# Patient Record
Sex: Female | Born: 1935 | Race: White | Hispanic: No | Smoking: Never smoker
Health system: Southern US, Community
[De-identification: ages and names within clinical notes are randomized; demographics above are authoritative.]

## PROBLEM LIST (undated history)

## (undated) DIAGNOSIS — L102 Pemphigus foliaceous: Secondary | ICD-10-CM

## (undated) DIAGNOSIS — I350 Nonrheumatic aortic (valve) stenosis: Secondary | ICD-10-CM

## (undated) DIAGNOSIS — Z8701 Personal history of pneumonia (recurrent): Secondary | ICD-10-CM

## (undated) DIAGNOSIS — I10 Essential (primary) hypertension: Secondary | ICD-10-CM

## (undated) DIAGNOSIS — I255 Ischemic cardiomyopathy: Secondary | ICD-10-CM

## (undated) DIAGNOSIS — I2109 ST elevation (STEMI) myocardial infarction involving other coronary artery of anterior wall: Secondary | ICD-10-CM

## (undated) DIAGNOSIS — F0390 Unspecified dementia without behavioral disturbance: Secondary | ICD-10-CM

## (undated) DIAGNOSIS — I5042 Chronic combined systolic (congestive) and diastolic (congestive) heart failure: Secondary | ICD-10-CM

## (undated) DIAGNOSIS — E119 Type 2 diabetes mellitus without complications: Secondary | ICD-10-CM

## (undated) DIAGNOSIS — J449 Chronic obstructive pulmonary disease, unspecified: Secondary | ICD-10-CM

## (undated) DIAGNOSIS — R609 Edema, unspecified: Secondary | ICD-10-CM

## (undated) HISTORY — DX: Chronic combined systolic (congestive) and diastolic (congestive) heart failure: I50.42

## (undated) HISTORY — DX: Pemphigus foliaceous: L10.2

## (undated) HISTORY — PX: ABDOMINAL SURGERY: SHX537

## (undated) HISTORY — DX: Ischemic cardiomyopathy: I25.5

## (undated) HISTORY — DX: Personal history of pneumonia (recurrent): Z87.01

## (undated) HISTORY — DX: Nonrheumatic aortic (valve) stenosis: I35.0

## (undated) HISTORY — DX: ST elevation (STEMI) myocardial infarction involving other coronary artery of anterior wall: I21.09

## (undated) HISTORY — DX: Essential (primary) hypertension: I10

---

## 2001-03-21 ENCOUNTER — Encounter: Payer: Self-pay | Admitting: Internal Medicine

## 2001-03-21 ENCOUNTER — Ambulatory Visit (HOSPITAL_COMMUNITY): Admission: RE | Admit: 2001-03-21 | Discharge: 2001-03-21 | Payer: Self-pay | Admitting: Internal Medicine

## 2001-08-06 ENCOUNTER — Other Ambulatory Visit: Admission: RE | Admit: 2001-08-06 | Discharge: 2001-08-06 | Payer: Self-pay | Admitting: Obstetrics and Gynecology

## 2002-03-23 ENCOUNTER — Encounter: Payer: Self-pay | Admitting: Internal Medicine

## 2002-03-23 ENCOUNTER — Ambulatory Visit (HOSPITAL_COMMUNITY): Admission: RE | Admit: 2002-03-23 | Discharge: 2002-03-23 | Payer: Self-pay | Admitting: Internal Medicine

## 2003-03-26 ENCOUNTER — Ambulatory Visit (HOSPITAL_COMMUNITY): Admission: RE | Admit: 2003-03-26 | Discharge: 2003-03-26 | Payer: Self-pay | Admitting: Internal Medicine

## 2003-03-26 ENCOUNTER — Encounter: Payer: Self-pay | Admitting: Internal Medicine

## 2004-03-27 ENCOUNTER — Ambulatory Visit (HOSPITAL_COMMUNITY): Admission: RE | Admit: 2004-03-27 | Discharge: 2004-03-27 | Payer: Self-pay | Admitting: Internal Medicine

## 2005-03-28 ENCOUNTER — Ambulatory Visit (HOSPITAL_COMMUNITY): Admission: RE | Admit: 2005-03-28 | Discharge: 2005-03-28 | Payer: Self-pay | Admitting: Internal Medicine

## 2006-05-27 ENCOUNTER — Ambulatory Visit (HOSPITAL_COMMUNITY): Admission: RE | Admit: 2006-05-27 | Discharge: 2006-05-27 | Payer: Self-pay | Admitting: Internal Medicine

## 2007-09-09 ENCOUNTER — Ambulatory Visit (HOSPITAL_COMMUNITY): Admission: RE | Admit: 2007-09-09 | Discharge: 2007-09-09 | Payer: Self-pay | Admitting: Internal Medicine

## 2010-07-18 ENCOUNTER — Ambulatory Visit (HOSPITAL_COMMUNITY)
Admission: RE | Admit: 2010-07-18 | Discharge: 2010-07-18 | Payer: Self-pay | Source: Home / Self Care | Attending: Internal Medicine | Admitting: Internal Medicine

## 2010-08-02 ENCOUNTER — Ambulatory Visit (HOSPITAL_COMMUNITY)
Admission: RE | Admit: 2010-08-02 | Discharge: 2010-08-02 | Payer: Self-pay | Source: Home / Self Care | Attending: Internal Medicine | Admitting: Internal Medicine

## 2014-01-30 ENCOUNTER — Encounter (HOSPITAL_COMMUNITY): Payer: Self-pay | Admitting: Emergency Medicine

## 2014-01-30 ENCOUNTER — Emergency Department (HOSPITAL_COMMUNITY): Payer: Medicare Other

## 2014-01-30 ENCOUNTER — Other Ambulatory Visit: Payer: Self-pay

## 2014-01-30 ENCOUNTER — Inpatient Hospital Stay (HOSPITAL_COMMUNITY)
Admission: EM | Admit: 2014-01-30 | Discharge: 2014-02-04 | DRG: 280 | Disposition: A | Discharge status: Skilled Nursing Facility | Payer: Medicare Other | Attending: Internal Medicine | Admitting: Internal Medicine

## 2014-01-30 DIAGNOSIS — I5041 Acute combined systolic (congestive) and diastolic (congestive) heart failure: Secondary | ICD-10-CM | POA: Diagnosis present

## 2014-01-30 DIAGNOSIS — I7090 Unspecified atherosclerosis: Secondary | ICD-10-CM | POA: Diagnosis present

## 2014-01-30 DIAGNOSIS — Z7982 Long term (current) use of aspirin: Secondary | ICD-10-CM

## 2014-01-30 DIAGNOSIS — I359 Nonrheumatic aortic valve disorder, unspecified: Secondary | ICD-10-CM | POA: Diagnosis present

## 2014-01-30 DIAGNOSIS — F0390 Unspecified dementia without behavioral disturbance: Secondary | ICD-10-CM | POA: Diagnosis present

## 2014-01-30 DIAGNOSIS — E785 Hyperlipidemia, unspecified: Secondary | ICD-10-CM

## 2014-01-30 DIAGNOSIS — I2109 ST elevation (STEMI) myocardial infarction involving other coronary artery of anterior wall: Principal | ICD-10-CM | POA: Diagnosis present

## 2014-01-30 DIAGNOSIS — I219 Acute myocardial infarction, unspecified: Secondary | ICD-10-CM | POA: Diagnosis present

## 2014-01-30 DIAGNOSIS — E119 Type 2 diabetes mellitus without complications: Secondary | ICD-10-CM | POA: Diagnosis present

## 2014-01-30 DIAGNOSIS — I213 ST elevation (STEMI) myocardial infarction of unspecified site: Secondary | ICD-10-CM

## 2014-01-30 DIAGNOSIS — R21 Rash and other nonspecific skin eruption: Secondary | ICD-10-CM | POA: Diagnosis present

## 2014-01-30 DIAGNOSIS — I509 Heart failure, unspecified: Secondary | ICD-10-CM | POA: Diagnosis present

## 2014-01-30 DIAGNOSIS — R600 Localized edema: Secondary | ICD-10-CM | POA: Diagnosis present

## 2014-01-30 DIAGNOSIS — R609 Edema, unspecified: Secondary | ICD-10-CM

## 2014-01-30 DIAGNOSIS — E876 Hypokalemia: Secondary | ICD-10-CM | POA: Diagnosis present

## 2014-01-30 DIAGNOSIS — I255 Ischemic cardiomyopathy: Secondary | ICD-10-CM

## 2014-01-30 DIAGNOSIS — I5021 Acute systolic (congestive) heart failure: Secondary | ICD-10-CM

## 2014-01-30 DIAGNOSIS — I519 Heart disease, unspecified: Secondary | ICD-10-CM

## 2014-01-30 DIAGNOSIS — Z66 Do not resuscitate: Secondary | ICD-10-CM | POA: Diagnosis present

## 2014-01-30 DIAGNOSIS — I10 Essential (primary) hypertension: Secondary | ICD-10-CM | POA: Diagnosis present

## 2014-01-30 LAB — BASIC METABOLIC PANEL
Anion gap: 12 (ref 5–15)
BUN: 24 mg/dL — ABNORMAL HIGH (ref 6–23)
CO2: 25 mEq/L (ref 19–32)
Calcium: 9.3 mg/dL (ref 8.4–10.5)
Chloride: 104 mEq/L (ref 96–112)
Creatinine, Ser: 0.71 mg/dL (ref 0.50–1.10)
GFR calc Af Amer: 81 mL/min — ABNORMAL LOW (ref >90)
GFR calc non Af Amer: 70 mL/min — ABNORMAL LOW (ref >90)
Glucose, Bld: 144 mg/dL — ABNORMAL HIGH (ref 70–99)
Potassium: 4.6 mEq/L (ref 3.7–5.3)
Sodium: 141 mEq/L (ref 137–147)

## 2014-01-30 LAB — URINALYSIS, ROUTINE W REFLEX MICROSCOPIC
Bilirubin Urine: NEGATIVE
Glucose, UA: NEGATIVE mg/dL
Hgb urine dipstick: NEGATIVE
Ketones, ur: NEGATIVE mg/dL
Leukocytes, UA: NEGATIVE
Nitrite: NEGATIVE
Protein, ur: NEGATIVE mg/dL
Specific Gravity, Urine: 1.02 (ref 1.005–1.030)
Urobilinogen, UA: 0.2 mg/dL (ref 0.0–1.0)
pH: 5 (ref 5.0–8.0)

## 2014-01-30 LAB — CBC WITH DIFFERENTIAL/PLATELET
Basophils Absolute: 0 10*3/uL (ref 0.0–0.1)
Basophils Relative: 1 % (ref 0–1)
Eosinophils Absolute: 0 10*3/uL (ref 0.0–0.7)
Eosinophils Relative: 0 % (ref 0–5)
HCT: 40.8 % (ref 36.0–46.0)
Hemoglobin: 13.5 g/dL (ref 12.0–15.0)
Lymphocytes Relative: 8 % — ABNORMAL LOW (ref 12–46)
Lymphs Abs: 0.6 10*3/uL — ABNORMAL LOW (ref 0.7–4.0)
MCH: 31.9 pg (ref 26.0–34.0)
MCHC: 33.1 g/dL (ref 30.0–36.0)
MCV: 96.5 fL (ref 78.0–100.0)
Monocytes Absolute: 0.7 10*3/uL (ref 0.1–1.0)
Monocytes Relative: 9 % (ref 3–12)
Neutro Abs: 6.5 10*3/uL (ref 1.7–7.7)
Neutrophils Relative %: 82 % — ABNORMAL HIGH (ref 43–77)
Platelets: 247 10*3/uL (ref 150–400)
RBC: 4.23 MIL/uL (ref 3.87–5.11)
RDW: 14.3 % (ref 11.5–15.5)
WBC: 7.8 10*3/uL (ref 4.0–10.5)

## 2014-01-30 LAB — TROPONIN I
Troponin I: 2.19 ng/mL (ref <0.30)
Troponin I: 1.81 ng/mL (ref <0.30)
Troponin I: 1.66 ng/mL (ref <0.30)
Troponin I: 1.53 ng/mL (ref <0.30)

## 2014-01-30 LAB — PRO B NATRIURETIC PEPTIDE: Pro B Natriuretic peptide (BNP): 15235 pg/mL — ABNORMAL HIGH (ref 0–450)

## 2014-01-30 LAB — TSH: TSH: 6.38 u[IU]/mL — ABNORMAL HIGH (ref 0.350–4.500)

## 2014-01-30 LAB — HEPARIN LEVEL (UNFRACTIONATED): Heparin Unfractionated: 0.16 IU/mL — ABNORMAL LOW (ref 0.30–0.70)

## 2014-01-30 LAB — MRSA PCR SCREENING: MRSA BY PCR: NEGATIVE

## 2014-01-30 MED ORDER — ASPIRIN 81 MG PO CHEW
324.0000 mg | CHEWABLE_TABLET | ORAL | Status: AC
  Administered 2014-01-30: 324 mg via ORAL
  Filled 2014-01-30: qty 4

## 2014-01-30 MED ORDER — ATORVASTATIN CALCIUM 40 MG PO TABS
40.0000 mg | ORAL_TABLET | Freq: Every day | ORAL | Status: DC
  Administered 2014-01-30 – 2014-02-03 (×5): 40 mg via ORAL
  Filled 2014-01-30 (×6): qty 1

## 2014-01-30 MED ORDER — HEPARIN (PORCINE) IN NACL 100-0.45 UNIT/ML-% IJ SOLN
900.0000 [IU]/h | INTRAMUSCULAR | Status: DC
  Administered 2014-01-30 (×2): 700 [IU]/h via INTRAVENOUS
  Administered 2014-01-30 – 2014-01-31 (×2): 900 [IU]/h via INTRAVENOUS
  Filled 2014-01-30 (×2): qty 250

## 2014-01-30 MED ORDER — HEPARIN BOLUS VIA INFUSION
1000.0000 [IU] | Freq: Once | INTRAVENOUS | Status: AC
  Administered 2014-01-30: 1000 [IU] via INTRAVENOUS
  Filled 2014-01-30: qty 1000

## 2014-01-30 MED ORDER — FUROSEMIDE 10 MG/ML IJ SOLN
40.0000 mg | Freq: Two times a day (BID) | INTRAMUSCULAR | Status: DC
  Administered 2014-01-30 – 2014-01-31 (×3): 40 mg via INTRAVENOUS
  Filled 2014-01-30 (×4): qty 4

## 2014-01-30 MED ORDER — ASPIRIN 81 MG PO CHEW
324.0000 mg | CHEWABLE_TABLET | Freq: Once | ORAL | Status: AC
  Administered 2014-01-30: 324 mg via ORAL
  Filled 2014-01-30: qty 4

## 2014-01-30 MED ORDER — ONDANSETRON HCL 4 MG/2ML IJ SOLN
4.0000 mg | Freq: Four times a day (QID) | INTRAMUSCULAR | Status: DC | PRN
  Administered 2014-01-30 – 2014-02-04 (×2): 4 mg via INTRAVENOUS
  Filled 2014-01-30 (×2): qty 2

## 2014-01-30 MED ORDER — ACETAMINOPHEN 325 MG PO TABS
650.0000 mg | ORAL_TABLET | ORAL | Status: DC | PRN
Start: 2014-01-30 — End: 2014-02-04
  Administered 2014-01-30: 650 mg via ORAL
  Filled 2014-01-30: qty 2

## 2014-01-30 MED ORDER — FUROSEMIDE 10 MG/ML IJ SOLN
40.0000 mg | Freq: Once | INTRAMUSCULAR | Status: AC
  Administered 2014-01-30: 40 mg via INTRAVENOUS
  Filled 2014-01-30: qty 4

## 2014-01-30 MED ORDER — ASPIRIN 300 MG RE SUPP: 300.0000 mg | RECTAL | Status: AC

## 2014-01-30 MED ORDER — HEPARIN BOLUS VIA INFUSION
3000.0000 [IU] | Freq: Once | INTRAVENOUS | Status: AC
  Administered 2014-01-30: 3000 [IU] via INTRAVENOUS

## 2014-01-30 MED ORDER — METOPROLOL TARTRATE 25 MG PO TABS
25.0000 mg | ORAL_TABLET | Freq: Two times a day (BID) | ORAL | Status: DC
  Administered 2014-01-30 – 2014-02-01 (×5): 25 mg via ORAL
  Filled 2014-01-30 (×5): qty 1

## 2014-01-30 MED ORDER — NITROGLYCERIN 0.4 MG SL SUBL: 0.4000 mg | SUBLINGUAL_TABLET | SUBLINGUAL | Status: DC | PRN

## 2014-01-30 MED ORDER — ALBUTEROL SULFATE (2.5 MG/3ML) 0.083% IN NEBU
INHALATION_SOLUTION | RESPIRATORY_TRACT | Status: AC
  Administered 2014-01-30: 2.5 mg
  Filled 2014-01-30: qty 3

## 2014-01-30 MED ORDER — LOSARTAN POTASSIUM 50 MG PO TABS
100.0000 mg | ORAL_TABLET | Freq: Every day | ORAL | Status: DC
  Administered 2014-01-30 – 2014-02-04 (×6): 100 mg via ORAL
  Filled 2014-01-30 (×6): qty 2

## 2014-01-30 MED ORDER — ASPIRIN EC 81 MG PO TBEC
81.0000 mg | DELAYED_RELEASE_TABLET | Freq: Every day | ORAL | Status: DC
  Administered 2014-01-31 – 2014-02-01 (×2): 81 mg via ORAL
  Filled 2014-01-30 (×3): qty 1

## 2014-01-30 MED ORDER — METOPROLOL TARTRATE 1 MG/ML IV SOLN
2.5000 mg | Freq: Once | INTRAVENOUS | Status: AC
  Administered 2014-01-30: 2.5 mg via INTRAVENOUS
  Filled 2014-01-30: qty 5

## 2014-01-30 NOTE — H&P (Signed)
[facility] History and Physical  Carla Ward ASN:[id number] DOB: 10-22-1914 DOA: 01/30/2014  Referring physician: Dr. Juleen China PCP: Carylon Perches, MD   Chief Complaint: chest pain  HPI: Carla Ward is a 80 y.o. female with history of hypertension, chronic lower extremity edema, no documented cardiac history per patient, presents to the emergency room with complaints of chest pain. She reports onset of symptoms occurring last night around 9:30pm. She describes substernal chest pain which was nonradiating. Associated with shortness of breath, nausea, diaphoresis. She reports having these symptoms for several hours through the night until she called EMS early in the morning. Upon arrival to the emergency room, she reports that her chest pain had resolved. She still does feel mildly short of breath. She's had no fever. She has a chronic cough. No vomiting or diarrhea. She is a nonsmoker. She was evaluated in the emergency room where troponin was slightly positive at 2.19. EKG showed ST elevations in 1, aVL and V6 as well as Q waves in V2 V3. Case was discussed by Dr. Juleen China with Dr. Ladona Ridgel, on call for cardiology at [facility]. It was felt that since patient is no longer having active chest pain, she is hemodynamically stable, and with her advanced age, cardiac catheterization will likely not be pursued at this time. Recommendations were to continue with medical management and admit to [facility]   Review of Systems:  Pertinent positives as per HPI, otherwise negative.  Past Medical History  Diagnosis Date  . Hypertension    Past Surgical History  Procedure Laterality Date  . Abdominal surgery     Social History:  reports that she has never smoked. She does not have any smokeless tobacco history on file. She reports that she does not drink alcohol or use illicit drugs.  Not on File  Family history: reviewed with patient, non contributory   Prior to Admission medications   Not  on File   Physical Exam: Filed Vitals:   01/30/14 0930  BP: 137/79  Pulse: 78  Temp:   Resp: 17    BP 137/79  Pulse 78  Temp(Src) 97.8 F (36.6 C) (Oral)  Resp 17  Ht 5\' 3"  (1.6 m)  Wt 57.153 kg (126 lb)  BMI 22.33 kg/m2  SpO2 97%  General:  Appears calm and comfortable Eyes: PERRL, normal lids, irises & conjunctiva ENT: grossly normal hearing, lips & tongue Neck: no LAD, masses or thyromegaly Cardiovascular: RRR, systolic ejection murmur. 2-3+ edema bilaterally Telemetry: SR, no arrhythmias  Respiratory: crackles at bases Abdomen: soft, ntnd Skin: no rash or induration seen on limited exam Musculoskeletal: grossly normal tone BUE/BLE Psychiatric: grossly normal mood and affect, speech fluent and appropriate Neurologic: grossly non-focal.          Labs on Admission:  Basic Metabolic Panel:  Recent Labs Lab 01/30/14 0743  NA 141  K 4.6  CL 104  CO2 25  GLUCOSE 144*  BUN 24*  CREATININE 0.71  CALCIUM 9.3   Liver Function Tests: No results found for this basename: AST, ALT, ALKPHOS, BILITOT, PROT, ALBUMIN,  in the last 168 hours No results found for this basename: LIPASE, AMYLASE,  in the last 168 hours No results found for this basename: AMMONIA,  in the last 168 hours CBC:  Recent Labs Lab 01/30/14 0743  WBC 7.8  NEUTROABS 6.5  HGB 13.5  HCT 40.8  MCV 96.5  PLT 247   Cardiac Enzymes:  Recent Labs Lab 01/30/14 0743  TROPONINI 2.19*  BNP (last 3 results)  Recent Labs  01/30/14 0743  PROBNP 15235.0*   CBG: No results found for this basename: GLUCAP,  in the last 168 hours  Radiological Exams on Admission: Dg Chest Portable 1 View  01/30/2014   CLINICAL DATA:  Weakness.  EXAM: PORTABLE CHEST - 1 VIEW  COMPARISON:  None.  FINDINGS: The heart is mildly enlarged. Bilateral pleural effusions are present. Bibasilar airspace disease likely reflects atelectasis. Mild diffuse edema is superimposed on chronic interstitial coarsening.  Atherosclerotic changes are noted at the aortic arch. The upper lung fields are clear consolidation. The visualized soft tissues and bony thorax are unremarkable.  IMPRESSION: 1. Congestive heart failure. 2. Bibasilar airspace disease likely reflects atelectasis. 3. Atherosclerosis.   Electronically Signed   By: Gennette Pachris  Mattern M.D.   On: 01/30/2014 08:31    EKG: Independently reviewed. Anterior Q waves, ST elevation I, avL, V6  Assessment/Plan Principal Problem:   Acute MI Active Problems:   Acute CHF   Hypertension   Edema of both legs   1. Acute myocardial infarction. Case was discussed with cardiology by ER and recommendations were to admit the patient hospital with cardiology consultation on Monday. It was felt that cardiac catheterization would not be pursued at this time. This may be revisited if patient has recurrent chest pain or becomes hemodynamically unstable. Patient has already been started on a heparin infusion. We'll continue aspirin. Start on metoprolol twice a day. We will continue her home dose of losartan. We will use nitroglycerin when necessary for chest pain. She'll be started on a statin. We'll check fasting lipid panel. We'll request a cardiology consultation, but this will not happen until Monday. Will order echocardiogram 2. Acute congestive heart failure. Patient likely has systolic dysfunction. We'll check echocardiogram. BNP is markedly elevated. We'll start the patient on intravenous Lasix. Place Foley catheter for accurate intake and output. Daily weights. Continue on home dose ARB and add beta blocker. 3. Hypertension. Stable 4. Chronic lower extremity edema. Patient's son reports that she's had edema for many years now. We'll continue with Lasix.  Code Status: DNR Family Communication: discussed with patient and son who is POA Disposition Plan: Admit to step down  Time spent: 60mins  [facility] PcMEMON,Merilynn Haydu [facility] Pager [phone]  **Disclaimer: This  note may have been dictated with voice recognition software. Similar sounding words can inadvertently be transcribed and this note may contain transcription errors which may not have been corrected upon publication of note.**

## 2014-01-30 NOTE — ED Notes (Signed)
MD at bedside. 

## 2014-01-30 NOTE — Progress Notes (Addendum)
PT has wheezes most likely related to CHF treating prn with albuterol 2.5mg  neb. Potassium level 4.6 , ok.

## 2014-01-30 NOTE — ED Notes (Signed)
Pt arrived in ED via EMS after feeling "weak" and "bad" since yesterday. Pt reports feeling an "unusual feeling" in her chest.

## 2014-01-30 NOTE — ED Notes (Signed)
Admitting MD at bedside.

## 2014-01-30 NOTE — Progress Notes (Addendum)
ANTICOAGULATION CONSULT NOTE - Initial Consult  Pharmacy Consult for Heparin Indication: chest pain/ACS  Not on File  Patient Measurements: Height: 5\' 3"  (160 cm) Weight: 126 lb (57.153 kg) IBW/kg (Calculated) : 52.4  Vital Signs: Temp: 97.8 F (36.6 C) (07/04 0709) Temp src: Oral (07/04 0709) BP: 129/61 mmHg (07/04 0730) Pulse Rate: 31 (07/04 0730)  Labs:  Recent Labs  01/30/14 0743  HGB 13.5  HCT 40.8  PLT 247  CREATININE 0.71  TROPONINI 2.19*    Estimated Creatinine Clearance: 33.3 ml/min (by C-G formula based on Cr of 0.71).   Medical History: Past Medical History  Diagnosis Date  . Hypertension     Medications:  Scheduled:  . furosemide  40 mg Intravenous Once    Assessment: 80 yo F who presented to ED with unusual feeling in her chest.  Troponin elevated.  CBC reviewed.  No bleeding noted.  PMH/home meds reviewed with RN- she is not on any anticoagulants at home.   Goal of Therapy:  Heparin level 0.3-0.7 units/ml Monitor platelets by anticoagulation protocol: Yes   Plan:  Give 3000 units bolus x 1 Start heparin infusion at 700 units/hr Check anti-Xa level in 8 hours and daily while on heparin Continue to monitor H&H and platelets  Myisha Pickerel, [facility] 01/30/2014,8:44 AM  Initial heparin level sub-therapeutic.  No bleeding noted.  Increase heparin to 900 units/hr.  Re-bolus 1000 units IV heparin x1.  Recheck 8hr heparin level with morning labs.   Junita Push, PharmD, BCPS 01/30/2014@9 :43 PM

## 2014-01-30 NOTE — ED Notes (Signed)
CRITICAL VALUE ALERT  Critical value received:  Troponin 2.19  Date of notification:  01/30/14 Time of notification:  0834  Critical value read back:Yes.    Nurse who received alert:  t vogler rn  MD notified (1st page):  kohut  Time of first page:  [phone]  MD notified (2nd page):  Time of second page:  Responding MD:    Time MD responded:

## 2014-01-30 NOTE — Progress Notes (Signed)
Notified on call pharmacist of low heparin level.  Changes made to gtt and will recheck labwork in am.  Order already present.

## 2014-01-30 NOTE — ED Provider Notes (Signed)
CSN: [id number]     Arrival date & time    History  This chart was scribed for Carla Razor, MD by [place] Peace, ED Scribe. The patient was seen in APA18/APA18. The patient's care was started at 7:26 AM.    Chief Complaint  Patient presents with  . Weakness      The history is provided by the patient and a relative. No language interpreter was used.   HPI Comments: Carla Ward is a 80 y.o. female who arrived to the ED via EMS presents to the Emergency Department complaining of weakness and feeling sick onset last night with associated nausea, vomiting, and chest pain. Pt's niece states that she is on an antibiotic Doxycycline to address possible cellulitis.. Pt has history of severe bilateral swelling to left and right lower aspect of her legs. Takes lasix intermittently. Feels like this swelling has been stable recently. Her BP is 131/82. Pt denies fever or chills. Her PCP is Dr. Ouida Sills.   Past Medical History  Diagnosis Date  . Hypertension    Past Surgical History  Procedure Laterality Date  . Abdominal surgery     No family history on file. History  Substance Use Topics  . Smoking status: Never Smoker   . Smokeless tobacco: Not on file  . Alcohol Use: No   OB History   Grav Para Term Preterm Abortions TAB SAB Ect Mult Living                 Review of Systems  Constitutional: Negative for fever and chills.  Cardiovascular: Positive for chest pain and leg swelling (bilateral).  Gastrointestinal: Positive for nausea and vomiting.  Skin: Positive for rash (under both L and R breast).  All other systems reviewed and are negative.     Allergies  Review of patient's allergies indicates not on file.  Home Medications   Prior to Admission medications   Not on File   Triage Vitals: BP 131/82  Pulse 94  Temp(Src) 97.8 F (36.6 C) (Oral)  Resp 22  Ht 5\' 3"  (1.6 m)  Wt 126 lb (57.153 kg)  BMI 22.33 kg/m2  SpO2 95% Physical Exam  Nursing note and vitals  reviewed. Constitutional: She is oriented to person, place, and time. She appears well-developed and well-nourished. No distress.  HENT:  Head: Normocephalic and atraumatic.  Eyes: Conjunctivae and EOM are normal.  Neck: Neck supple. No tracheal deviation present.  Cardiovascular: Normal rate.   Frequent ectopy.  Pulmonary/Chest: Effort normal. No respiratory distress.  Musculoskeletal: She exhibits edema (symmertrical pitting to lower left and right legs).  Neurological: She is alert and oriented to person, place, and time.  Skin: Skin is warm and dry. There is erythema.  Erythema, mild skin desquamation to b/l breasts. No area concerning for abscess.  Psychiatric: She has a normal mood and affect. Her behavior is normal.    ED Course  Procedures (including critical care time)  CRITICAL CARE Performed by: Carla Ward  Total critical care time: 35 minutes  Critical care time was exclusive of separately billable procedures and treating other patients. Critical care was necessary to treat or prevent imminent or life-threatening deterioration. Critical care was time spent personally by me on the following activities: development of treatment plan with patient and/or surrogate as well as nursing, discussions with consultants, evaluation of patient's response to treatment, examination of patient, obtaining history from patient or surrogate, ordering and performing treatments and interventions, ordering and review of laboratory studies, ordering  and review of radiographic studies, pulse oximetry and re-evaluation of patient's condition.  DIAGNOSTIC STUDIES: Oxygen Saturation is 95% on room air, adequate by my interpretation.    COORDINATION OF CARE: 7:30 AM- Treatment plan was discussed with patient who verbalizes understanding and agrees.   8:45 AM- Findings and results from tests were discussed with Pt. Admittance into the hospital is warranted and Pt agrees.   Labs Review Labs  Reviewed  CBC WITH DIFFERENTIAL - Abnormal; Notable for the following:    Neutrophils Relative % 82 (*)    Lymphocytes Relative 8 (*)    Lymphs Abs 0.6 (*)    All other components within normal limits  BASIC METABOLIC PANEL - Abnormal; Notable for the following:    Glucose, Bld 144 (*)    BUN 24 (*)    GFR calc non Af Amer 70 (*)    GFR calc Af Amer 81 (*)    All other components within normal limits  TROPONIN I - Abnormal; Notable for the following:    Troponin I 2.19 (*)    All other components within normal limits  PRO B NATRIURETIC PEPTIDE - Abnormal; Notable for the following:    Pro B Natriuretic peptide (BNP) 15235.0 (*)    All other components within normal limits  URINALYSIS, ROUTINE W REFLEX MICROSCOPIC  HEPARIN LEVEL (UNFRACTIONATED)    Imaging Review Dg Chest Portable 1 View  01/30/2014   CLINICAL DATA:  Weakness.  EXAM: PORTABLE CHEST - 1 VIEW  COMPARISON:  None.  FINDINGS: The heart is mildly enlarged. Bilateral pleural effusions are present. Bibasilar airspace disease likely reflects atelectasis. Mild diffuse edema is superimposed on chronic interstitial coarsening. Atherosclerotic changes are noted at the aortic arch. The upper lung fields are clear consolidation. The visualized soft tissues and bony thorax are unremarkable.  IMPRESSION: 1. Congestive heart failure. 2. Bibasilar airspace disease likely reflects atelectasis. 3. Atherosclerosis.   Electronically Signed   By: Gennette Pac M.D.   On: 01/30/2014 08:31     EKG Interpretation None      EKG:  Rhythm: sinus with PVCs Rate: 95 PR: 74 ms QRS: 83 ms QTc: 483 ms Anterior infarct, age uncertain LAD ST segments: STE I, aVL and to lesser extent V6. Consider higher lateral STEMI. Comparison: no old  Medications  furosemide (LASIX) injection 40 mg (not administered)  heparin bolus via infusion 3,000 Units (not administered)  heparin ADULT infusion 100 units/mL (25000 units/250 mL) (not administered)   metoprolol (LOPRESSOR) injection 2.5 mg (not administered)  aspirin chewable tablet 324 mg (324 mg Oral Given 01/30/14 0840)    MDM   Final diagnoses:  ST elevation myocardial infarction (STEMI), unspecified artery  Heart failure, unspecified heart failure chronicity, unspecified heart failure type    89yF with generalized weakness. CP and nausea last night. EKG on arrival actually concerning for possible high lateral STEMI, but pt endorsing that symptoms have resolved at this point. Anterior q waves of uncertain age. Troponin subsequently came back significantly elevated. ASA/Heparin. Will discuss with cardiology if would like transfer to [facility] or if think can medically manage at [facility].   Discussed with Dr Ladona Ridgel, cardiology. Given advanced age, HD stability and lack of symptoms at this point she's not great candidate for cath. He feels she can appropriately be medically managed at [facility] with reconsideration if new/worsening symptoms or other concerning factors. BB. Heparin. Will discuss with medicine.   I personally preformed the services scribed in my presence. The recorded information  has been reviewed is accurate. Carla RazorStephen Quenten Nawaz, MD.    Carla RazorStephen Gloria Lambertson, MD 01/30/14 [phone]

## 2014-01-30 NOTE — Progress Notes (Signed)
Patient noted to be wet with foley catheter in place. Pt states she feels "like she has to pee and then feels a "gush" of urine". Foley assessed and 3mL of fluid is in balloon, when gently pulled back on, foley catheter easily removed with balloon inflated and intact with no discomfort from patient. New foley catheter inserted via sterile insertion, perineal area cleansed with soap and running water prior to insertion, clear, yellow urine returned, balloon inflated with 20mL saline. Patient has no complaints of pain or discomfort at this time.

## 2014-01-31 DIAGNOSIS — I359 Nonrheumatic aortic valve disorder, unspecified: Secondary | ICD-10-CM

## 2014-01-31 LAB — CBC
HCT: 39.5 % (ref 36.0–46.0)
HEMOGLOBIN: 13 g/dL (ref 12.0–15.0)
MCH: 31.7 pg (ref 26.0–34.0)
MCHC: 32.9 g/dL (ref 30.0–36.0)
MCV: 96.3 fL (ref 78.0–100.0)
Platelets: 248 10*3/uL (ref 150–400)
RBC: 4.1 MIL/uL (ref 3.87–5.11)
RDW: 14.3 % (ref 11.5–15.5)
WBC: 8.2 10*3/uL (ref 4.0–10.5)

## 2014-01-31 LAB — BASIC METABOLIC PANEL
Anion gap: 13 (ref 5–15)
BUN: 26 mg/dL — ABNORMAL HIGH (ref 6–23)
CALCIUM: 8.7 mg/dL (ref 8.4–10.5)
CO2: 27 mEq/L (ref 19–32)
CREATININE: 0.83 mg/dL (ref 0.50–1.10)
Chloride: 102 mEq/L (ref 96–112)
GFR calc Af Amer: 66 mL/min — ABNORMAL LOW (ref >90)
GFR, EST NON AFRICAN AMERICAN: 57 mL/min — AB (ref >90)
Glucose, Bld: 123 mg/dL — ABNORMAL HIGH (ref 70–99)
Potassium: 4 mEq/L (ref 3.7–5.3)
Sodium: 142 mEq/L (ref 137–147)

## 2014-01-31 LAB — LIPID PANEL
CHOL/HDL RATIO: 2.8 ratio
Cholesterol: 194 mg/dL (ref 0–200)
HDL: 70 mg/dL (ref >39)
LDL Cholesterol: 111 mg/dL — ABNORMAL HIGH (ref 0–99)
TRIGLYCERIDES: 67 mg/dL (ref <150)
VLDL: 13 mg/dL (ref 0–40)

## 2014-01-31 LAB — HEPARIN LEVEL (UNFRACTIONATED): HEPARIN UNFRACTIONATED: 0.55 [IU]/mL (ref 0.30–0.70)

## 2014-01-31 MED ORDER — LORAZEPAM 2 MG/ML IJ SOLN
0.5000 mg | INTRAMUSCULAR | Status: DC | PRN
  Administered 2014-01-31 – 2014-02-02 (×2): 0.5 mg via INTRAVENOUS
  Filled 2014-01-31 (×3): qty 1

## 2014-01-31 MED ORDER — BIOTENE DRY MOUTH MT LIQD
15.0000 mL | Freq: Two times a day (BID) | OROMUCOSAL | Status: DC
  Administered 2014-01-31 – 2014-02-03 (×7): 15 mL via OROMUCOSAL

## 2014-01-31 NOTE — Progress Notes (Signed)
Subjective: Carla Ward was admitted yesterday by the hospitalist after experiencing chest pain and shortness of breath. She had a troponin of 2 with an EKG revealing ST segment elevation in the anterolateral leads. Chest treated with IV heparin. Chest pain has resolved. Her troponin is dropped to 1.5. This morning's EKG is pending. She had shortness of breath which was relieved after treatment with IV Lasix. She had a BNP over 15,000 and evidence of CHF by chest x-ray. She states that her shortness of breath much improved now. She is eating breakfast and is comfortable appearing and alert. She is accompanied by her grandson [name]. DO NOT RESUSCITATE orders are in place. The decision has been made to treat her medically and not to proceed with further cardiology evaluation or intervention.  Objective: Vital signs in last 24 hours: Filed Vitals:   01/31/14 0600 01/31/14 0700 01/31/14 0730 01/31/14 0848  BP: 162/133 96/79  138/74  Pulse: 76 70  85  Temp:   98 F (36.7 C)   TempSrc:   Oral   Resp: 21 19    Height:      Weight:      SpO2: 97% 98%     Weight change:   Intake/Output Summary (Last 24 hours) at 01/31/14 0850 Last data filed at 01/31/14 0500  Gross per 24 hour  Intake     97 ml  Output   1275 ml  Net  -1178 ml    Physical Exam: Comfortable appearing. Lungs reveal minimal basilar rales. Heart regular with a rate in the 80s with a grade 2 systolic murmur. Blood pressures in the 1:30 systolic. Abdomen soft and nontender. She has a rash on her chest which is recently been treated by the dermatologist. She has stable chronic edema in her lower extremities.  Lab Results:    Results for orders placed during the hospital encounter of 01/30/14 (from the past 24 hour(s))  URINALYSIS, ROUTINE W REFLEX MICROSCOPIC     Status: None   Collection Time    01/30/14  9:57 AM      Result Value Ref Range   Color, Urine YELLOW  YELLOW   APPearance CLEAR  CLEAR   Specific Gravity, Urine  1.020  1.005 - 1.030   pH 5.0  5.0 - 8.0   Glucose, UA NEGATIVE  NEGATIVE mg/dL   Hgb urine dipstick NEGATIVE  NEGATIVE   Bilirubin Urine NEGATIVE  NEGATIVE   Ketones, ur NEGATIVE  NEGATIVE mg/dL   Protein, ur NEGATIVE  NEGATIVE mg/dL   Urobilinogen, UA 0.2  0.0 - 1.0 mg/dL   Nitrite NEGATIVE  NEGATIVE   Leukocytes, UA NEGATIVE  NEGATIVE  MRSA PCR SCREENING     Status: None   Collection Time    01/30/14 11:20 AM      Result Value Ref Range   MRSA by PCR NEGATIVE  NEGATIVE  TROPONIN I     Status: Abnormal   Collection Time    01/30/14 11:44 AM      Result Value Ref Range   Troponin I 1.81 (*) <0.30 ng/mL  TSH     Status: Abnormal   Collection Time    01/30/14 11:44 AM      Result Value Ref Range   TSH 6.380 (*) 0.350 - 4.500 uIU/mL  HEPARIN LEVEL (UNFRACTIONATED)     Status: Abnormal   Collection Time    01/30/14  5:00 PM      Result Value Ref Range   Heparin Unfractionated 0.16 (*)  0.30 - 0.70 IU/mL  TROPONIN I     Status: Abnormal   Collection Time    01/30/14  5:00 PM      Result Value Ref Range   Troponin I 1.66 (*) <0.30 ng/mL  TROPONIN I     Status: Abnormal   Collection Time    01/30/14 11:03 PM      Result Value Ref Range   Troponin I 1.53 (*) <0.30 ng/mL  HEPARIN LEVEL (UNFRACTIONATED)     Status: None   Collection Time    01/31/14  4:46 AM      Result Value Ref Range   Heparin Unfractionated 0.55  0.30 - 0.70 IU/mL  CBC     Status: None   Collection Time    01/31/14  4:46 AM      Result Value Ref Range   WBC 8.2  4.0 - 10.5 K/uL   RBC 4.10  3.87 - 5.11 MIL/uL   Hemoglobin 13.0  12.0 - 15.0 g/dL   HCT 72.0  94.7 - 09.6 %   MCV 96.3  78.0 - 100.0 fL   MCH 31.7  26.0 - 34.0 pg   MCHC 32.9  30.0 - 36.0 g/dL   RDW 28.3  66.2 - 94.7 %   Platelets 248  150 - 400 K/uL  BASIC METABOLIC PANEL     Status: Abnormal   Collection Time    01/31/14  4:46 AM      Result Value Ref Range   Sodium 142  137 - 147 mEq/L   Potassium 4.0  3.7 - 5.3 mEq/L   Chloride  102  96 - 112 mEq/L   CO2 27  19 - 32 mEq/L   Glucose, Bld 123 (*) 70 - 99 mg/dL   BUN 26 (*) 6 - 23 mg/dL   Creatinine, Ser 6.54  0.50 - 1.10 mg/dL   Calcium 8.7  8.4 - 65.0 mg/dL   GFR calc non Af Amer 57 (*) >90 mL/min   GFR calc Af Amer 66 (*) >90 mL/min   Anion gap 13  5 - 15     ABGS No results found for this basename: PHART, PCO2, PO2ART, TCO2, HCO3,  in the last 72 hours CULTURES Recent Results (from the past 240 hour(s))  MRSA PCR SCREENING     Status: None   Collection Time    01/30/14 11:20 AM      Result Value Ref Range Status   MRSA by PCR NEGATIVE  NEGATIVE Final   Comment:            The GeneXpert MRSA Assay (FDA     approved for NASAL specimens     only), is one component of a     comprehensive MRSA colonization     surveillance program. It is not     intended to diagnose MRSA     infection nor to guide or     monitor treatment for     MRSA infections.   Studies/Results: Dg Chest Portable 1 View  01/30/2014   CLINICAL DATA:  Weakness.  EXAM: PORTABLE CHEST - 1 VIEW  COMPARISON:  None.  FINDINGS: The heart is mildly enlarged. Bilateral pleural effusions are present. Bibasilar airspace disease likely reflects atelectasis. Mild diffuse edema is superimposed on chronic interstitial coarsening. Atherosclerotic changes are noted at the aortic arch. The upper lung fields are clear consolidation. The visualized soft tissues and bony thorax are unremarkable.  IMPRESSION: 1. Congestive heart failure. 2. Bibasilar airspace  disease likely reflects atelectasis. 3. Atherosclerosis.   Electronically Signed   By: Gennette Pac M.D.   On: 01/30/2014 08:31   Micro Results: Recent Results (from the past 240 hour(s))  MRSA PCR SCREENING     Status: None   Collection Time    01/30/14 11:20 AM      Result Value Ref Range Status   MRSA by PCR NEGATIVE  NEGATIVE Final   Comment:            The GeneXpert MRSA Assay (FDA     approved for NASAL specimens     only), is one component  of a     comprehensive MRSA colonization     surveillance program. It is not     intended to diagnose MRSA     infection nor to guide or     monitor treatment for     MRSA infections.   Studies/Results: Dg Chest Portable 1 View  01/30/2014   CLINICAL DATA:  Weakness.  EXAM: PORTABLE CHEST - 1 VIEW  COMPARISON:  None.  FINDINGS: The heart is mildly enlarged. Bilateral pleural effusions are present. Bibasilar airspace disease likely reflects atelectasis. Mild diffuse edema is superimposed on chronic interstitial coarsening. Atherosclerotic changes are noted at the aortic arch. The upper lung fields are clear consolidation. The visualized soft tissues and bony thorax are unremarkable.  IMPRESSION: 1. Congestive heart failure. 2. Bibasilar airspace disease likely reflects atelectasis. 3. Atherosclerosis.   Electronically Signed   By: Gennette Pac M.D.   On: 01/30/2014 08:31   Medications:  I have reviewed the patient's current medications Scheduled Meds: . antiseptic oral rinse  15 mL Mouth Rinse BID  . aspirin EC  81 mg Oral Daily  . atorvastatin  40 mg Oral q1800  . furosemide  40 mg Intravenous BID  . losartan  100 mg Oral Daily  . metoprolol tartrate  25 mg Oral BID   Continuous Infusions: . heparin 900 Units/hr (01/31/14 0849)   PRN Meds:.acetaminophen, nitroGLYCERIN, ondansetron (ZOFRAN) IV   Assessment/Plan: #1. Acute anterolateral MI. Echo pending. Cardiac enzymes improved. Continue IV heparin, Toprol, aspirin and atorvastatin. Cardiology consult tomorrow. #2. CHF. Continue IV Lasix. Renal function and electrolytes are stable. #3. Hypertension. #4. Type 2 diabetes controlled with nonpharmacologic therapy. #5. Cognitive impairment. Stable. #6. Rash. Family will bring in medications previously prescribed by dermatology. Principal Problem:   Acute MI Active Problems:   Acute CHF   Hypertension   Edema of both legs     LOS: 1 day   Avia Merkley 01/31/2014, 8:50 AM

## 2014-01-31 NOTE — Progress Notes (Signed)
Echocardiogram 2D Echocardiogram has been performed.  Carla Ward 01/31/2014, 2:53 PM

## 2014-01-31 NOTE — Progress Notes (Signed)
This a.m. Patient has had increasing levels of confusion and hallucinations.  Stated she sees a fluttering bug to the right of the table.  I assured her there was nothing there.  Continually removing telemetry electrodes, at present asking if i see the bugs flying around in the room, "are they bugs or ants."?  Hears the phone   While in room just now patient aspirated on a sip of coffee.  Was able to cough up perhaps all of it.  No further distress.  sats remain 95% and breath sounds remain dim/crackles and expiratory wheezing.

## 2014-01-31 NOTE — Progress Notes (Signed)
ANTICOAGULATION CONSULT NOTE  Pharmacy Consult for Heparin Indication: chest pain/ACS  No Known Allergies  Patient Measurements: Height: 5\' 3"  (160 cm) Weight: 134 lb 4.2 oz (60.9 kg) IBW/kg (Calculated) : 52.4  Vital Signs: Temp: 98 F (36.7 C) (07/05 0730) Temp src: Oral (07/05 0730) BP: 96/79 mmHg (07/05 0700) Pulse Rate: 70 (07/05 0700)  Labs:  Recent Labs  01/30/14 0743 01/30/14 1144 01/30/14 1700 01/30/14 2303 01/31/14 0446  HGB 13.5  --   --   --  13.0  HCT 40.8  --   --   --  39.5  PLT 247  --   --   --  248  HEPARINUNFRC  --   --  0.16*  --  0.55  CREATININE 0.71  --   --   --  0.83  TROPONINI 2.19* 1.81* 1.66* 1.53*  --     Estimated Creatinine Clearance: 32 ml/min (by C-G formula based on Cr of 0.83).   Medical History: Past Medical History  Diagnosis Date  . Hypertension     Medications:  Scheduled:  . antiseptic oral rinse  15 mL Mouth Rinse BID  . aspirin EC  81 mg Oral Daily  . atorvastatin  40 mg Oral q1800  . furosemide  40 mg Intravenous BID  . losartan  100 mg Oral Daily  . metoprolol tartrate  25 mg Oral BID    Assessment: 80 yo F who presented to ED with unusual feeling in her chest.  Troponin elevated, but trending down.  Heparin level is therapeutic this morning.  CBC stable.  No bleeding noted.   Goal of Therapy:  Heparin level 0.3-0.7 units/ml Monitor platelets by anticoagulation protocol: Yes   Plan:  Continue heparin infusion at 900 units/hr Daily heparin level & CBC F/U cardiology plan on Mon- anticipate 48hrs of heparin for med mgmt   Elson Clan 01/31/2014,8:20 AM

## 2014-02-01 DIAGNOSIS — I519 Heart disease, unspecified: Secondary | ICD-10-CM

## 2014-02-01 DIAGNOSIS — I5041 Acute combined systolic (congestive) and diastolic (congestive) heart failure: Secondary | ICD-10-CM

## 2014-02-01 DIAGNOSIS — I2589 Other forms of chronic ischemic heart disease: Secondary | ICD-10-CM

## 2014-02-01 DIAGNOSIS — E785 Hyperlipidemia, unspecified: Secondary | ICD-10-CM

## 2014-02-01 LAB — CBC
HCT: 37.7 % (ref 36.0–46.0)
Hemoglobin: 12.2 g/dL (ref 12.0–15.0)
MCH: 31.8 pg (ref 26.0–34.0)
MCHC: 32.4 g/dL (ref 30.0–36.0)
MCV: 98.2 fL (ref 78.0–100.0)
PLATELETS: 205 10*3/uL (ref 150–400)
RBC: 3.84 MIL/uL — ABNORMAL LOW (ref 3.87–5.11)
RDW: 14.4 % (ref 11.5–15.5)
WBC: 6.1 10*3/uL (ref 4.0–10.5)

## 2014-02-01 LAB — BASIC METABOLIC PANEL
ANION GAP: 11 (ref 5–15)
BUN: 33 mg/dL — ABNORMAL HIGH (ref 6–23)
CALCIUM: 8.4 mg/dL (ref 8.4–10.5)
CO2: 29 mEq/L (ref 19–32)
Chloride: 103 mEq/L (ref 96–112)
Creatinine, Ser: 0.95 mg/dL (ref 0.50–1.10)
GFR calc Af Amer: 56 mL/min — ABNORMAL LOW (ref >90)
GFR calc non Af Amer: 49 mL/min — ABNORMAL LOW (ref >90)
Glucose, Bld: 113 mg/dL — ABNORMAL HIGH (ref 70–99)
Potassium: 3.5 mEq/L — ABNORMAL LOW (ref 3.7–5.3)
Sodium: 143 mEq/L (ref 137–147)

## 2014-02-01 LAB — HEPARIN LEVEL (UNFRACTIONATED): Heparin Unfractionated: 0.44 IU/mL (ref 0.30–0.70)

## 2014-02-01 MED ORDER — FUROSEMIDE 10 MG/ML IJ SOLN: 40.0000 mg | Freq: Two times a day (BID) | INTRAMUSCULAR | Status: DC

## 2014-02-01 MED ORDER — KETOCONAZOLE 2 % EX CREA
TOPICAL_CREAM | Freq: Two times a day (BID) | CUTANEOUS | Status: DC | PRN
  Administered 2014-02-01 – 2014-02-04 (×2): via TOPICAL

## 2014-02-01 MED ORDER — ASPIRIN 325 MG PO TABS
325.0000 mg | ORAL_TABLET | Freq: Every day | ORAL | Status: DC
  Administered 2014-02-01 – 2014-02-04 (×4): 325 mg via ORAL
  Filled 2014-02-01 (×4): qty 1

## 2014-02-01 MED ORDER — SPIRONOLACTONE 25 MG PO TABS
12.5000 mg | ORAL_TABLET | Freq: Every day | ORAL | Status: DC
  Administered 2014-02-01 – 2014-02-04 (×4): 12.5 mg via ORAL
  Filled 2014-02-01 (×4): qty 1

## 2014-02-01 MED ORDER — POTASSIUM CHLORIDE CRYS ER 20 MEQ PO TBCR
20.0000 meq | EXTENDED_RELEASE_TABLET | Freq: Three times a day (TID) | ORAL | Status: AC
  Administered 2014-02-01 – 2014-02-02 (×5): 20 meq via ORAL
  Filled 2014-02-01 (×7): qty 1

## 2014-02-01 MED ORDER — FUROSEMIDE 40 MG PO TABS
40.0000 mg | ORAL_TABLET | Freq: Every day | ORAL | Status: DC
  Administered 2014-02-02: 40 mg via ORAL
  Filled 2014-02-01: qty 1

## 2014-02-01 MED ORDER — FUROSEMIDE 10 MG/ML IJ SOLN
40.0000 mg | Freq: Every day | INTRAMUSCULAR | Status: DC
  Administered 2014-02-01: 40 mg via INTRAVENOUS

## 2014-02-01 MED ORDER — METOPROLOL SUCCINATE ER 25 MG PO TB24
25.0000 mg | ORAL_TABLET | Freq: Every day | ORAL | Status: DC
  Administered 2014-02-02: 25 mg via ORAL
  Filled 2014-02-01: qty 1

## 2014-02-01 MED ORDER — DOXYCYCLINE HYCLATE 100 MG PO TABS
100.0000 mg | ORAL_TABLET | Freq: Two times a day (BID) | ORAL | Status: DC
  Administered 2014-02-01 – 2014-02-04 (×7): 100 mg via ORAL
  Filled 2014-02-01 (×7): qty 1

## 2014-02-01 NOTE — Consult Note (Signed)
The patient was seen and examined, and I agree with the assessment and plan as documented above, with modifications as noted below. Pt admitted with chest pain and ECG evidence for anterolateral STEMI (elevation in I, aVL, mild V3 elevation, and V6) which was medically managed given advanced age and symptom resolution. Troponins peaked at 2.19. Pt now free of chest pain, and uncertain as to why she needs to remain in the hospital. Denies shortness of breath and palpitations. EF 20-25%, with akinesis of anteroseptal wall, and currently being treated for acute systolic heart failure.  RECS: Continue conservative medical management. Will increase ASA to 325 mg daily. Would recommend switching metoprolol to long-acting Toprol-XL 25 mg daily given severely reduced LV systolic function. Would hesitate to increase dose of beta blocker given the fact that she is currently being treated for acute systolic heart failure. Continue ARB and Lipitor. As BUN has trended from 24-->33, will switch to Lasix 40 mg daily and spironolactone 12.5 mg daily.

## 2014-02-01 NOTE — Progress Notes (Signed)
UR chart review completed.  

## 2014-02-01 NOTE — Progress Notes (Signed)
Pt's son brought in his mother's PO abx and topical cream to treat the skin infection on her chest. MD was paged and he was agreeable to pt receiving medication as prescribed on the bottles. Orders have been placed, will have son pick up pt's bottle of doxycycline as our pharmacy will be able to dispense it. Will keep pt's cream at bedside. Will continue to monitor.

## 2014-02-01 NOTE — Progress Notes (Signed)
Pt to be transferred upstairs today. Report has been called to receiving nurse, family and pt are aware of transfer, and pt is stable at this time. All belongings will transfer with pt upstairs. Will continue to monitor.

## 2014-02-01 NOTE — Progress Notes (Signed)
Subjective: Mrs. Kallal denies any chest pain this morning. She denies any difficulty breathing.  Objective: Vital signs in last 24 hours: Filed Vitals:   02/01/14 0600 02/01/14 0630 02/01/14 0700 02/01/14 0730  BP: 89/51 96/42 96/44  88/59  Pulse: 74 46 39 68  Temp:      TempSrc:      Resp: 15 13 13 15   Height:      Weight:      SpO2: 92% 94% 92% 89%   Weight change: 11 lb 2 oz (5.047 kg)  Intake/Output Summary (Last 24 hours) at 02/01/14 0747 Last data filed at 02/01/14 0600  Gross per 24 hour  Intake    678 ml  Output   1475 ml  Net   -797 ml    Physical Exam: Alert and comfortable appearing. Lungs reveal faint basilar rales. Heart regular with occasional ectopy with a grade 2 systolic murmur abdomen is soft and nontender. Extremities reveal no significant edema. She thought she was at home initially but then relapsed she was at the hospital today.  Lab Results:    Results for orders placed during the hospital encounter of 01/30/14 (from the past 24 hour(s))  HEPARIN LEVEL (UNFRACTIONATED)     Status: None   Collection Time    02/01/14  4:42 AM      Result Value Ref Range   Heparin Unfractionated 0.44  0.30 - 0.70 IU/mL  CBC     Status: Abnormal   Collection Time    02/01/14  4:42 AM      Result Value Ref Range   WBC 6.1  4.0 - 10.5 K/uL   RBC 3.84 (*) 3.87 - 5.11 MIL/uL   Hemoglobin 12.2  12.0 - 15.0 g/dL   HCT 40.9  81.1 - 91.4 %   MCV 98.2  78.0 - 100.0 fL   MCH 31.8  26.0 - 34.0 pg   MCHC 32.4  30.0 - 36.0 g/dL   RDW 78.2  95.6 - 21.3 %   Platelets 205  150 - 400 K/uL  BASIC METABOLIC PANEL     Status: Abnormal   Collection Time    02/01/14  4:42 AM      Result Value Ref Range   Sodium 143  137 - 147 mEq/L   Potassium 3.5 (*) 3.7 - 5.3 mEq/L   Chloride 103  96 - 112 mEq/L   CO2 29  19 - 32 mEq/L   Glucose, Bld 113 (*) 70 - 99 mg/dL   BUN 33 (*) 6 - 23 mg/dL   Creatinine, Ser 0.86  0.50 - 1.10 mg/dL   Calcium 8.4  8.4 - 57.8 mg/dL   GFR calc non Af  Amer 49 (*) >90 mL/min   GFR calc Af Amer 56 (*) >90 mL/min   Anion gap 11  5 - 15     ABGS No results found for this basename: PHART, PCO2, PO2ART, TCO2, HCO3,  in the last 72 hours CULTURES Recent Results (from the past 240 hour(s))  MRSA PCR SCREENING     Status: None   Collection Time    01/30/14 11:20 AM      Result Value Ref Range Status   MRSA by PCR NEGATIVE  NEGATIVE Final   Comment:            The GeneXpert MRSA Assay (FDA     approved for NASAL specimens     only), is one component of a     comprehensive MRSA colonization  surveillance program. It is not     intended to diagnose MRSA     infection nor to guide or     monitor treatment for     MRSA infections.   Studies/Results: Dg Chest Portable 1 View  01/30/2014   CLINICAL DATA:  Weakness.  EXAM: PORTABLE CHEST - 1 VIEW  COMPARISON:  None.  FINDINGS: The heart is mildly enlarged. Bilateral pleural effusions are present. Bibasilar airspace disease likely reflects atelectasis. Mild diffuse edema is superimposed on chronic interstitial coarsening. Atherosclerotic changes are noted at the aortic arch. The upper lung fields are clear consolidation. The visualized soft tissues and bony thorax are unremarkable.  IMPRESSION: 1. Congestive heart failure. 2. Bibasilar airspace disease likely reflects atelectasis. 3. Atherosclerosis.   Electronically Signed   By: Gennette Pac M.D.   On: 01/30/2014 08:31   Micro Results: Recent Results (from the past 240 hour(s))  MRSA PCR SCREENING     Status: None   Collection Time    01/30/14 11:20 AM      Result Value Ref Range Status   MRSA by PCR NEGATIVE  NEGATIVE Final   Comment:            The GeneXpert MRSA Assay (FDA     approved for NASAL specimens     only), is one component of a     comprehensive MRSA colonization     surveillance program. It is not     intended to diagnose MRSA     infection nor to guide or     monitor treatment for     MRSA infections.    Studies/Results: Dg Chest Portable 1 View  01/30/2014   CLINICAL DATA:  Weakness.  EXAM: PORTABLE CHEST - 1 VIEW  COMPARISON:  None.  FINDINGS: The heart is mildly enlarged. Bilateral pleural effusions are present. Bibasilar airspace disease likely reflects atelectasis. Mild diffuse edema is superimposed on chronic interstitial coarsening. Atherosclerotic changes are noted at the aortic arch. The upper lung fields are clear consolidation. The visualized soft tissues and bony thorax are unremarkable.  IMPRESSION: 1. Congestive heart failure. 2. Bibasilar airspace disease likely reflects atelectasis. 3. Atherosclerosis.   Electronically Signed   By: Gennette Pac M.D.   On: 01/30/2014 08:31   Medications:  I have reviewed the patient's current medications Scheduled Meds: . antiseptic oral rinse  15 mL Mouth Rinse BID  . aspirin EC  81 mg Oral Daily  . atorvastatin  40 mg Oral q1800  . furosemide  40 mg Intravenous Daily  . losartan  100 mg Oral Daily  . metoprolol tartrate  25 mg Oral BID   Continuous Infusions:  PRN Meds:.acetaminophen, LORazepam, nitroGLYCERIN, ondansetron (ZOFRAN) IV   Assessment/Plan: #1. Anteroseptal MI. Echo reveals an ejection fraction of 20-25%. There is akinesis of the anteroseptal myocardial. She has moderate to severe aortic stenosis and moderate pericardial effusion. Continue metoprolol, aspirin, atorvastatin and losartan. Will stop heparin. Cardiology consult today. Discussed with cardiology. Blood pressures running in the 90 systolic range at this point. Oxygen saturation are running in the mid 90s. Decrease IV Lasix to 40 mg IV once a day. #2. Acute systolic heart failure. #3. Diabetes. Stable control without an oral agent. #4. Dementia. Stable. DO NOT RESUSCITATE order in place. #5. Hypokalemia. Serum potassium has dropped to 3.5. Supplement orally. Principal Problem:   Acute MI Active Problems:   Acute CHF   Hypertension   Edema of both legs      LOS: 2 days  Shaneisha Burkel 02/01/2014, 7:47 AM

## 2014-02-01 NOTE — Consult Note (Signed)
CARDIOLOGY CONSULT NOTE   Patient ID: Carla Ward MRN: [id number] DOB/AGE: May 21, 1915 80 y.o.  Admit Date: 01/30/2014 Referring Physician: Carylon Perches MD Primary Physician: Carylon Perches, MD Consulting Cardiologist: Prentice Docker MD Primary Cardiologist: New Reason for Consultation:Chest pain and systolic dysfunction per echo  Clinical Summary Carla Ward is a 80 y.o.female with no prior cardiac history, being treated for hypertension admitted via ER with chest pain and found to have positive Troponin with abnormal EKG.  Patient is a poor historian and does not know why she is in the hospital, does not remember coming here, or having chest pain.She can not recall any prior cardiac work ups, and is only being followed by PCP. She is also treated for chronic LEE with po lasix. History is obtained from current medical record.   On arrival to ER, BP 131/82, HR 94, O2 sat 95%. Afebrile. Glucose slightly elevated at 144 with BUN of 24. Troponin 2.14. Pro-BNP of 15, 235. CXR demonstrated CHF and bilateral pleural effusions. EKG demonstrated NSR with PVC's with subsequent EKG demonstrating ST elevation in the anterior lateral leads, LVH,  with  changes in lead I. Follow up Troponin showing downward trend to  1.81; 1.66, 1.53 respectively. TSH 6.3.  She was treated with IV lasix, ASA, heparin bolus, leading to gtt, which is now discontinued, metoprolol 2.5 mg IV. She spent the night in the ICU and is now on the telemetry floor. She has diuresed approx 2 liters. Weight is not indicative of diureses, as it is climbing.  Echo completed 01/31/2014 demonstrated significant systolic dysfunction with EF of 20-25%. We are asked to assist with medical management.   Of notes, she lives alone, manages her own home, does not drive, but sews quilts and clothes and is able to take care of herself until this admission.   No Known Allergies  Medications Scheduled Medications: . antiseptic oral rinse  15 mL  Mouth Rinse BID  . aspirin EC  81 mg Oral Daily  . atorvastatin  40 mg Oral q1800  . doxycycline  100 mg Oral Q12H  . furosemide  40 mg Intravenous Daily  . losartan  100 mg Oral Daily  . metoprolol tartrate  25 mg Oral BID  . potassium chloride  20 mEq Oral TID    Infusions:    PRN Medications: acetaminophen, ketoconazole, LORazepam, nitroGLYCERIN, ondansetron (ZOFRAN) IV   Past Medical History  Diagnosis Date  . Hypertension     Past Surgical History  Procedure Laterality Date  . Abdominal surgery      Family Hx: Patient does not recall family history.  Social History Carla Ward reports that she has never smoked. She does not have any smokeless tobacco history on file. Carla Ward reports that she does not drink alcohol.  Review of Systems Otherwise reviewed and negative except as outlined.  Physical Examination Blood pressure 117/47, pulse 79, temperature 98.2 F (36.8 C), temperature source Oral, resp. rate 20, height 5\' 3"  (1.6 m), weight 137 lb 6.4 oz (62.324 kg), SpO2 97.00%.  Intake/Output Summary (Last 24 hours) at 02/01/14 1528 Last data filed at 02/01/14 1004  Gross per 24 hour  Intake    735 ml  Output   1650 ml  Net   -915 ml    Telemetry: NSR  GEN: Awake alert, confused about her stay in hospital. No complaints of chest pain. HEENT: Conjunctiva and lids normal, oropharynx clear with moist mucosa. Neck: Supple, no elevated JVP or carotid bruits, no thyromegaly.  Lungs: Diminished bibasilar, no wheezes  Cardiac:Distant heart sounds. Regular rate and rhythm 1/6 systolic murmur, no pericardial rub. Abdomen: Soft, nontender, no hepatomegaly, bowel sounds present, no guarding or rebound. Extremities:2+  pitting edema,venous stasis skin thickening.  Musculoskeletal: No kyphosis. Neuropsychiatric: Alert and poor memory. affect grossly appropriate. Tremor noted with head movement.  Prior Cardiac Testing/Procedures 1.Echocardiogram Left ventricle: The  cavity size was normal. There was moderate concentric hypertrophy. Systolic function was severely reduced. The estimated ejection fraction was in the range of 20% to 25%. There is akinesis of the entireanteroseptal myocardium. Doppler parameters are consistent with abnormal left ventricular relaxation (grade 1 diastolic dysfunction). Doppler parameters are consistent with high ventricular filling pressure. - Aortic valve: Cusp separation was reduced. There was moderate to severe stenosis. There was trivial regurgitation. - Left atrium: The atrium was moderately dilated. - Pericardium, extracardiac: There was a moderate-sized left pleural effusion.   Lab Results  Basic Metabolic Panel:  Recent Labs Lab 01/30/14 0743 01/31/14 0446 02/01/14 0442  NA 141 142 143  K 4.6 4.0 3.5*  CL 104 102 103  CO2 25 27 29   GLUCOSE 144* 123* 113*  BUN 24* 26* 33*  CREATININE 0.71 0.83 0.95  CALCIUM 9.3 8.7 8.4   CBC:  Recent Labs Lab 01/30/14 0743 01/31/14 0446 02/01/14 0442  WBC 7.8 8.2 6.1  NEUTROABS 6.5  --   --   HGB 13.5 13.0 12.2  HCT 40.8 39.5 37.7  MCV 96.5 96.3 98.2  PLT 247 248 205    Cardiac Enzymes:  Recent Labs Lab 01/30/14 0743 01/30/14 1144 01/30/14 1700 01/30/14 2303  TROPONINI 2.19* 1.81* 1.66* 1.53*    BNP: 15,235  Radiology: CXR 01/30/2014 The heart is mildly enlarged. Bilateral pleural effusions are  present. Bibasilar airspace disease likely reflects atelectasis.  Mild diffuse edema is superimposed on chronic interstitial  coarsening. Atherosclerotic changes are noted at the aortic arch.  The upper lung fields are clear consolidation. The visualized soft  tissues and bony thorax are unremarkable.  IMPRESSION:  1. Congestive heart failure.  2. Bibasilar airspace disease likely reflects atelectasis.  3. Atherosclerosis.   ECG: Admit: NSR with PVC's 01/31/2014 NSR with LVH- Q waves noted inferior and anterolateral.   Impression and  Recommendations 1. NSTEMI: Significant changes and possible ST elevation in the anterio/lateral leads with follow up EKG demonstrating Q-waves anterio/lateral. Heparin has been discontinued. Started on ASA, statin, metoprolol. She is not complaining of chest pain currently. Will treat medically due to advanced age, no planned cardiac cath or stress testing. Troponin is trending downward. THS was also found to be elevated.   2. Systolic Dysfunction:Newly found on echo, with EF of 20-25%. Uncertain if this is acute or chronic as she has been having chronic LEE and mild shortness of breath prior to coming into hospital BP is low normal. Remains on ARB, and BB. Creatinine 0.95. Will continue to treat medically. HR is well controlled currently. Would continue BB at current dose, but titrate up if possible to keep HR in the 60's.. Watching telemetry for arrhythmias.   3. Systolic CHF: She has diuresed at least 2 liters. Currently on 40 mg daily. Still has fluid on board in LEE with diminished breath sounds. Will increase to Q 12 hours for the next 24 hours. Home dose of lasix is 40 mg daily.  Consider adding spironolactone. She is on potassium replacement with K+ of 3.5.   4. Hypertension: BP currently controlled. May need to titrate up metoprolol. Keep BP <  115 systolic with EF of 20-25%.   5.Questionable dementia: Currently lives alone. Does not remember why she is here or what happened prior to admission. Would recommend social services and or case manager to evaluate for SNF or HHN as I am concerned about her ability to take care of herself with this current diagnosis of systolic dysfunction with recent MI.   6. DNR  Signed: Bettey MareKathryn M. Khalib Fendley NP  02/01/2014, 3:28 PM Co-Sign MD

## 2014-02-01 NOTE — Progress Notes (Signed)
Nurse in room assessing and administering medications. Pt confused to time and situation. Will continue to re-orient pt as needed. Pills swallowed whole without difficulty. Sacral redness with blanching; Pink foam dressing present, covering sacral area and is clean, dry & intact. Lower extremities swollen with +2 pitting edema. Bed placed in lowest position, call bell within reach & bed alarm "on". Will continue to monitor pt frequently throughout night.

## 2014-02-02 DIAGNOSIS — F0390 Unspecified dementia without behavioral disturbance: Secondary | ICD-10-CM

## 2014-02-02 LAB — BASIC METABOLIC PANEL
ANION GAP: 9 (ref 5–15)
BUN: 28 mg/dL — AB (ref 6–23)
CALCIUM: 8.6 mg/dL (ref 8.4–10.5)
CO2: 29 meq/L (ref 19–32)
CREATININE: 0.72 mg/dL (ref 0.50–1.10)
Chloride: 103 mEq/L (ref 96–112)
GFR calc Af Amer: 81 mL/min — ABNORMAL LOW (ref >90)
GFR calc non Af Amer: 70 mL/min — ABNORMAL LOW (ref >90)
Glucose, Bld: 120 mg/dL — ABNORMAL HIGH (ref 70–99)
Potassium: 4.3 mEq/L (ref 3.7–5.3)
Sodium: 141 mEq/L (ref 137–147)

## 2014-02-02 MED ORDER — ENOXAPARIN SODIUM 40 MG/0.4ML ~~LOC~~ SOLN
40.0000 mg | SUBCUTANEOUS | Status: DC
Start: 2014-02-02 — End: 2014-02-04
  Administered 2014-02-02 – 2014-02-04 (×3): 40 mg via SUBCUTANEOUS
  Filled 2014-02-02 (×3): qty 0.4

## 2014-02-02 MED ORDER — METOPROLOL SUCCINATE ER 50 MG PO TB24
50.0000 mg | ORAL_TABLET | Freq: Every day | ORAL | Status: DC
  Administered 2014-02-03 – 2014-02-04 (×2): 50 mg via ORAL
  Filled 2014-02-02 (×2): qty 1

## 2014-02-02 MED ORDER — METOPROLOL SUCCINATE ER 25 MG PO TB24
25.0000 mg | ORAL_TABLET | Freq: Once | ORAL | Status: DC
  Filled 2014-02-02: qty 1

## 2014-02-02 NOTE — Progress Notes (Signed)
Subjective: Carla Ward is awake and alert this morning. She denies any chest pain or shortness of breath. Appreciate cardiology consult.  Objective: Vital signs in last 24 hours: Filed Vitals:   02/01/14 1356 02/01/14 1439 02/01/14 2018 02/02/14 0450  BP:  117/47 109/59 105/73  Pulse:  79 58 80  Temp:  98.2 F (36.8 C) 98 F (36.7 C) 97.5 F (36.4 C)  TempSrc:  Oral Oral Oral  Resp:  20 20 20   Height:      Weight: 137 lb 6.4 oz (62.324 kg)   137 lb 9.1 oz (62.4 kg)  SpO2:  97% 99% 98%   Weight change: 4.4 oz (0.124 kg)  Intake/Output Summary (Last 24 hours) at 02/02/14 0744 Last data filed at 02/02/14 0630  Gross per 24 hour  Intake    600 ml  Output    875 ml  Net   -275 ml    Physical Exam: No distress. Lungs reveal minimal basilar rales. Heart regular with a grade 2 systolic murmur abdomen soft and nontender. Extremities reveal no significant edema.  Lab Results:    Results for orders placed during the hospital encounter of 01/30/14 (from the past 24 hour(s))  BASIC METABOLIC PANEL     Status: Abnormal   Collection Time    02/02/14  5:41 AM      Result Value Ref Range   Sodium 141  137 - 147 mEq/L   Potassium 4.3  3.7 - 5.3 mEq/L   Chloride 103  96 - 112 mEq/L   CO2 29  19 - 32 mEq/L   Glucose, Bld 120 (*) 70 - 99 mg/dL   BUN 28 (*) 6 - 23 mg/dL   Creatinine, Ser 1.610.72  0.50 - 1.10 mg/dL   Calcium 8.6  8.4 - 09.610.5 mg/dL   GFR calc non Af Amer 70 (*) >90 mL/min   GFR calc Af Amer 81 (*) >90 mL/min   Anion gap 9  5 - 15     ABGS No results found for this basename: PHART, PCO2, PO2ART, TCO2, HCO3,  in the last 72 hours CULTURES Recent Results (from the past 240 hour(s))  MRSA PCR SCREENING     Status: None   Collection Time    01/30/14 11:20 AM      Result Value Ref Range Status   MRSA by PCR NEGATIVE  NEGATIVE Final   Comment:            The GeneXpert MRSA Assay (FDA     approved for NASAL specimens     only), is one component of a     comprehensive  MRSA colonization     surveillance program. It is not     intended to diagnose MRSA     infection nor to guide or     monitor treatment for     MRSA infections.   Studies/Results: No results found. Micro Results: Recent Results (from the past 240 hour(s))  MRSA PCR SCREENING     Status: None   Collection Time    01/30/14 11:20 AM      Result Value Ref Range Status   MRSA by PCR NEGATIVE  NEGATIVE Final   Comment:            The GeneXpert MRSA Assay (FDA     approved for NASAL specimens     only), is one component of a     comprehensive MRSA colonization     surveillance program. It is not  intended to diagnose MRSA     infection nor to guide or     monitor treatment for     MRSA infections.   Studies/Results: No results found. Medications:  I have reviewed the patient's current medications Scheduled Meds: . antiseptic oral rinse  15 mL Mouth Rinse BID  . aspirin  325 mg Oral Daily  . atorvastatin  40 mg Oral q1800  . doxycycline  100 mg Oral Q12H  . enoxaparin (LOVENOX) injection  40 mg Subcutaneous Q24H  . furosemide  40 mg Oral Daily  . losartan  100 mg Oral Daily  . metoprolol succinate  25 mg Oral Daily  . potassium chloride  20 mEq Oral TID  . spironolactone  12.5 mg Oral Daily   Continuous Infusions:  PRN Meds:.acetaminophen, ketoconazole, LORazepam, nitroGLYCERIN, ondansetron (ZOFRAN) IV   Assessment/Plan: #1. Acute anteroseptal MI. Beta blocker therapy has been modified to Toprol-XL 25 mg daily. Lasix was switched to the oral route and spinal lactone was added yesterday. Will have the nursing staff get her out of bed today and try walking short distances. Remove catheter. #2. CHF. #3. Diabetes. Stable. #4. Rash. Dermatology initiated treatment restarted yesterday. #5. Hypokalemia. Serum potassium has normalized with oral replacement. Principal Problem:   Acute MI Active Problems:   Acute CHF   Hypertension   Edema of both legs     LOS: 3 days    Carla Ward 02/02/2014, 7:44 AM

## 2014-02-02 NOTE — Progress Notes (Signed)
SUBJECTIVE: Feeling well this morning, denying chest pain and shortness of breath.     Intake/Output Summary (Last 24 hours) at 02/02/14 1045 Last data filed at 02/02/14 1013  Gross per 24 hour  Intake    480 ml  Output    700 ml  Net   -220 ml    Current Facility-Administered Medications  Medication Dose Route Frequency Provider Last Rate Last Dose  . acetaminophen (TYLENOL) tablet 650 mg  650 mg Oral Q4H PRN Erick Blinks, MD   650 mg at 01/30/14 2144  . antiseptic oral rinse (BIOTENE) solution 15 mL  15 mL Mouth Rinse BID Meredeth Ide, MD   15 mL at 02/02/14 0800  . aspirin tablet 325 mg  325 mg Oral Daily Laqueta Linden, MD   325 mg at 02/02/14 0827  . atorvastatin (LIPITOR) tablet 40 mg  40 mg Oral q1800 Erick Blinks, MD   40 mg at 02/01/14 1820  . doxycycline (VIBRA-TABS) tablet 100 mg  100 mg Oral Q12H Carylon Perches, MD   100 mg at 02/02/14 0827  . enoxaparin (LOVENOX) injection 40 mg  40 mg Subcutaneous Q24H Carylon Perches, MD   40 mg at 02/02/14 0824  . furosemide (LASIX) tablet 40 mg  40 mg Oral Daily Laqueta Linden, MD   40 mg at 02/02/14 0826  . ketoconazole (NIZORAL) 2 % cream   Topical BID PRN Carylon Perches, MD      . LORazepam (ATIVAN) injection 0.5 mg  0.5 mg Intravenous Q4H PRN Alice Reichert, MD   0.5 mg at 01/31/14 1833  . losartan (COZAAR) tablet 100 mg  100 mg Oral Daily Erick Blinks, MD   100 mg at 02/02/14 0826  . metoprolol succinate (TOPROL-XL) 24 hr tablet 25 mg  25 mg Oral Daily Laqueta Linden, MD   25 mg at 02/02/14 0825  . nitroGLYCERIN (NITROSTAT) SL tablet 0.4 mg  0.4 mg Sublingual Q5 Min x 3 PRN Erick Blinks, MD      . ondansetron (ZOFRAN) injection 4 mg  4 mg Intravenous Q6H PRN Erick Blinks, MD   4 mg at 01/30/14 1635  . potassium chloride SA (K-DUR,KLOR-CON) CR tablet 20 mEq  20 mEq Oral TID Carylon Perches, MD   20 mEq at 02/02/14 0824  . spironolactone (ALDACTONE) tablet 12.5 mg  12.5 mg Oral Daily Laqueta Linden, MD   12.5 mg  at 02/02/14 0826    Filed Vitals:   02/01/14 1356 02/01/14 1439 02/01/14 2018 02/02/14 0450  BP:  117/47 109/59 105/73  Pulse:  79 58 80  Temp:  98.2 F (36.8 C) 98 F (36.7 C) 97.5 F (36.4 C)  TempSrc:  Oral Oral Oral  Resp:  20 20 20   Height:      Weight: 137 lb 6.4 oz (62.324 kg)   137 lb 9.1 oz (62.4 kg)  SpO2:  97% 99% 98%    PHYSICAL EXAM General: NAD, elderly, frail. Neck: No JVD. Lungs: Faint bibasilar rales. CV: Nondisplaced PMI.  Regular rate and rhythm, normal S1/markedly diminished S2, no S3/S4, 4/6 late-peaking ejection systolic murmur.  Trace pretibial edema.   Abdomen: Soft, nontender, no distention.  Neurologic: Alert. Psych: Normal affect. Extremities: No clubbing or cyanosis.   TELEMETRY: Reviewed telemetry pt in sinus rhythm with PVC's, occurring in couplets with one 4-beat run of NSVT.  LABS: Basic Metabolic Panel:  Recent Labs  16/43/53 0442 02/02/14 0541  NA 143 141  K 3.5* 4.3  CL 103 103  CO2 29 29  GLUCOSE 113* 120*  BUN 33* 28*  CREATININE 0.95 0.72  CALCIUM 8.4 8.6   Liver Function Tests: No results found for this basename: AST, ALT, ALKPHOS, BILITOT, PROT, ALBUMIN,  in the last 72 hours No results found for this basename: LIPASE, AMYLASE,  in the last 72 hours CBC:  Recent Labs  01/31/14 0446 02/01/14 0442  WBC 8.2 6.1  HGB 13.0 12.2  HCT 39.5 37.7  MCV 96.3 98.2  PLT 248 205   Cardiac Enzymes:  Recent Labs  01/30/14 1144 01/30/14 1700 01/30/14 2303  TROPONINI 1.81* 1.66* 1.53*   BNP: No components found with this basename: POCBNP,  D-Dimer: No results found for this basename: DDIMER,  in the last 72 hours Hemoglobin A1C: No results found for this basename: HGBA1C,  in the last 72 hours Fasting Lipid Panel:  Recent Labs  01/31/14 0446  CHOL 194  HDL 70  LDLCALC 111*  TRIG 67  CHOLHDL 2.8   Thyroid Function Tests:  Recent Labs  01/30/14 1144  TSH 6.380*   Anemia Panel: No results found for this  basename: VITAMINB12, FOLATE, FERRITIN, TIBC, IRON, RETICCTPCT,  in the last 72 hours  RADIOLOGY: Dg Chest Portable 1 View  01/30/2014   CLINICAL DATA:  Weakness.  EXAM: PORTABLE CHEST - 1 VIEW  COMPARISON:  None.  FINDINGS: The heart is mildly enlarged. Bilateral pleural effusions are present. Bibasilar airspace disease likely reflects atelectasis. Mild diffuse edema is superimposed on chronic interstitial coarsening. Atherosclerotic changes are noted at the aortic arch. The upper lung fields are clear consolidation. The visualized soft tissues and bony thorax are unremarkable.  IMPRESSION: 1. Congestive heart failure. 2. Bibasilar airspace disease likely reflects atelectasis. 3. Atherosclerosis.   Electronically Signed   By: Gennette Pachris  Mattern M.D.   On: 01/30/2014 08:31      ASSESSMENT AND PLAN: Pt admitted with chest pain and ECG evidence for anterolateral STEMI (elevation in I, aVL, mild V3 elevation, and V6) which was medically managed given advanced age and symptom resolution. Troponins peaked at 2.19.  Pt remains free of chest pain, shortness of breath and palpitations.  EF 20-25%, with akinesis of anteroseptal wall, and currently being treated for acute systolic heart failure.   RECS:  Continue conservative medical management. Continue ASA 325 mg daily, along with Toprol-XL. I will carefully increase this to 50 mg daily given severely reduced LV systolic function, with NSVT and frequent PVC's.  Continue ARB and Lipitor. Continue Lasix 40 mg daily and spironolactone 12.5 mg daily.  She will likely require a skilled nursing facility, given her advanced age, dementia, and comorbidities.    Prentice DockerSuresh Yadiel Aubry, M.D., F.A.C.C.

## 2014-02-03 MED ORDER — FUROSEMIDE 10 MG/ML IJ SOLN
20.0000 mg | Freq: Two times a day (BID) | INTRAMUSCULAR | Status: DC
  Administered 2014-02-03 – 2014-02-04 (×3): 20 mg via INTRAVENOUS
  Filled 2014-02-03 (×3): qty 2

## 2014-02-03 NOTE — Clinical Social Work Placement (Signed)
Clinical Social Work Department CLINICAL SOCIAL WORK PLACEMENT NOTE 02/03/2014  Patient:  Carla Ward, Carla Ward  Account Number:  [id number] Admit date:  01/30/2014  Clinical Social Worker:  Derenda Fennel, LCSW  Date/time:  02/03/2014 10:25 AM  Clinical Social Work is seeking post-discharge placement for this patient at the following level of care:   SKILLED NURSING   (*CSW will update this form in Epic as items are completed)   02/03/2014  Patient/family provided with [facility] Department of Clinical Social Work's list of facilities offering this level of care within the geographic area requested by the patient (or if unable, by the patient's family).  02/03/2014  Patient/family informed of their freedom to choose among providers that offer the needed level of care, that participate in Medicare, Medicaid or managed care program needed by the patient, have an available bed and are willing to accept the patient.  02/03/2014  Patient/family informed of [facility]' ownership interest in [facility], as well as of the fact that they are under no obligation to receive care at this facility.  PASARR submitted to EDS on 02/03/2014 PASARR number received on 02/03/2014  FL2 transmitted to all facilities in geographic area requested by pt/family on  02/03/2014 FL2 transmitted to all facilities within larger geographic area on   Patient informed that his/her managed care company has contracts with or will negotiate with  certain facilities, including the following:     Patient/family informed of bed offers received:  02/03/2014 Patient chooses bed at [facility] Physician recommends and patient chooses bed at  [facility]  Patient to be transferred to  on   Patient to be transferred to facility by  Patient and family notified of transfer on  Name of family member notified:    The following physician request were entered in Epic:   Additional Comments:  Derenda Fennel,  LCSW [phone]

## 2014-02-03 NOTE — Clinical Social Work Psychosocial (Signed)
Clinical Social Work Department BRIEF PSYCHOSOCIAL ASSESSMENT 02/03/2014  Patient:  Carla Ward, Carla Ward     Account Number:  [id number]     Admit date:  01/30/2014  Clinical Social Worker:  Nancie Neas  Date/Time:  02/03/2014 10:27 AM  Referred by:  CSW  Date Referred:  02/03/2014 Referred for  SNF Placement   Other Referral:   Interview type:  Family Other interview type:   son- Molly Maduro    PSYCHOSOCIAL DATA Living Status:  ALONE Admitted from facility:   Level of care:   Primary support name:  Molly Maduro Primary support relationship to patient:  CHILD, ADULT Degree of support available:   supportive    CURRENT CONCERNS Current Concerns  Post-Acute Placement   Other Concerns:    SOCIAL WORK ASSESSMENT / PLAN CSW attempted to meet pt at bedside, but pt sleeping. CSW called pt's son Molly Maduro to complete psychosocial assessment. Pt lives alone and generally does well on her own. She lives in a one story house and her niece lives very close to her. Molly Maduro lives about 25 miles away, but sees pt daily. He generally does household tasks for pt and provides transportation. Molly Maduro picks [employer] Whe[employer] for her. At baseline, pt ambulates with a walker. He indicates pt does have some confusion at baseline, particularly in the evenings. Admitted to hospital with MI. Pt evaluated by PT and recommendation is for SNF. CSW discussed placement process, including Medicare coverage/criteria. SNF list left in room. Robert requests placement in [place] if possible.   Assessment/plan status:  Psychosocial Support/Ongoing Assessment of Needs Other assessment/ plan:   Information/referral to community resources:   SNF list    PATIENT'S/FAMILY'S RESPONSE TO PLAN OF CARE: Pt unable to discuss plan of care at this time. Family feel pt would benefit from ST SNF for rehab. CSW will initiate bed search and follow up with offers when available.       Derenda Fennel, [place] [phone]

## 2014-02-03 NOTE — Progress Notes (Signed)
Patient ID: Carla Ward, female   DOB: 07/18/1915, 80 y.o.   MRN: [id number]     Subjective:   + SOB, no chest pain   Objective:   Temp:  [97.8 F (36.6 C)-98.7 F (37.1 C)] 98.7 F (37.1 C) (07/07 2131) Pulse Rate:  [75-88] 75 (07/08 0654) Resp:  [20] 20 (07/08 0654) BP: (100-133)/(76-84) 133/84 mmHg (07/08 0654) SpO2:  [90 %-98 %] 96 % (07/08 0758) Weight:  [136 lb 12.8 oz (62.052 kg)] 136 lb 12.8 oz (62.052 kg) (07/08 0500) Last BM Date: 01/29/14  Filed Weights   02/01/14 1356 02/02/14 0450 02/03/14 0500  Weight: 137 lb 6.4 oz (62.324 kg) 137 lb 9.1 oz (62.4 kg) 136 lb 12.8 oz (62.052 kg)    Intake/Output Summary (Last 24 hours) at 02/03/14 1001 Last data filed at 02/03/14 0830  Gross per 24 hour  Intake   1200 ml  Output    650 ml  Net    550 ml    Telemetry: NSR, frequent PVCs  Exam:  General: NAD  Resp: crackles bilateral bases  Cardiac: RRR, 2/6 systolic murmur RUSB, JVD just below angle of jaw  GI: abdomen soft, NT, ND  MSK: 1+ bilateral edema  Neuro: no focal deficits  Psych: appropriate affect  Lab Results:  Basic Metabolic Panel:  Recent Labs Lab 01/31/14 0446 02/01/14 0442 02/02/14 0541  NA 142 143 141  K 4.0 3.5* 4.3  CL 102 103 103  CO2 27 29 29   GLUCOSE 123* 113* 120*  BUN 26* 33* 28*  CREATININE 0.83 0.95 0.72  CALCIUM 8.7 8.4 8.6    Liver Function Tests: No results found for this basename: AST, ALT, ALKPHOS, BILITOT, PROT, ALBUMIN,  in the last 168 hours  CBC:  Recent Labs Lab 01/30/14 0743 01/31/14 0446 02/01/14 0442  WBC 7.8 8.2 6.1  HGB 13.5 13.0 12.2  HCT 40.8 39.5 37.7  MCV 96.5 96.3 98.2  PLT 247 248 205    Cardiac Enzymes:  Recent Labs Lab 01/30/14 1144 01/30/14 1700 01/30/14 2303  TROPONINI 1.81* 1.66* 1.53*    BNP:  Recent Labs  01/30/14 0743  PROBNP 15235.0*    Coagulation: No results found for this basename: INR,  in the last 168 hours  ECG:   Medications:   Scheduled  Medications: . antiseptic oral rinse  15 mL Mouth Rinse BID  . aspirin  325 mg Oral Daily  . atorvastatin  40 mg Oral q1800  . doxycycline  100 mg Oral Q12H  . enoxaparin (LOVENOX) injection  40 mg Subcutaneous Q24H  . furosemide  40 mg Oral Daily  . losartan  100 mg Oral Daily  . metoprolol succinate  25 mg Oral Once  . metoprolol succinate  50 mg Oral Daily  . spironolactone  12.5 mg Oral Daily     Infusions:     PRN Medications:  acetaminophen, ketoconazole, LORazepam, nitroGLYCERIN, ondansetron (ZOFRAN) IV     Assessment/Plan   1. Acute MI - from prior notes evidence of anterolateral STEMI that has been medically managed due to advanced aged and symptom resolution. Troponin peak at 2.19. LVEF 20-25% with akinesis of the anteroseptal wall.  - medical management with ASA, atorva, losartan, Toprol, aldactone. Will defer consideration for plavix to Dr Purvis SheffieldKoneswaran who has been following her. Risks vs benefits given higher risk of bleeding given her advanced age and fall risk.   2. Acute combined systolic/diastolic heart failure - LVEF 40-98%20-25% post MI with anteroseptal akinesis - initiated  on beta blocker, ARB, spiroronlactone - on oral lasix, +70 mL yesterday with just oral lasix 40mg  x 1 yesterday, Cr trended down. Total net negative 1.7 liters. Still appears volume overloaded on exam, still with oxygen requirement. Will change to lasix 20mg  IV bid for today.  3. Aortic stenosis - moderate to severe by echo - poor candidate for intervention consideration     Dina Rich, M.D., F.A.C.C.

## 2014-02-03 NOTE — Evaluation (Signed)
Physical Therapy Evaluation Patient Details Name: Carla Ward MRN: [id number] DOB: Sep 24, 1914 Today's Date: 02/03/2014   History of Present Illness  Carla Ward is a 80 y.o. female with history of hypertension, chronic lower extremity edema, no documented cardiac history per patient, presents to the emergency room with complaints of chest pain. She reports onset of symptoms occurring last night around 9:30pm. She describes substernal chest pain which was nonradiating. Associated with shortness of breath, nausea, diaphoresis. She reports having these symptoms for several hours through the night until she called EMS early in the morning. Upon arrival to the emergency room, she reports that her chest pain had resolved. She still does feel mildly short of breath. She's had no fever. She has a chronic cough. No vomiting or diarrhea. She is a nonsmoker. She was evaluated in the emergency room where troponin was slightly positive at 2.19. EKG showed ST elevations in 1, aVL and V6 as well as Q waves in V2 V3. Case was discussed by Dr. Juleen China with Dr. Ladona Ridgel, on call for cardiology at [facility]. It was felt that since patient is no longer having active chest pain, she is hemodynamically stable, and with her advanced age, cardiac catheterization will likely not be pursued at this time.   Clinical Impression  Pt is a 80 year old female who presents to physical therapy with dx of acute MI.  Pt lives alone in a single story home with 1 step to enter through the back door.  Pt reports she was mod (I) with bed mobility skills, transfers, and household amb with use of rollator.  Pt reports 1 fall in the past year, though pt/family report she has multiple stumbles throughout the day.  During evaluation, the pt required min assist for bed mobility skills, mod assist for transfers, and min assist and use of RW for amb of ~5 feet.  Noted decreased activity tolerance with functional mobility skills requiring seated rest break  after evaluation, which limited amb distance.  Recommend continued PT while in the hospital to address strengthening, balance, activity tolerance for improvement of functional mobility skills with transition to SNF for continued rehab.  No DME recommendations at this time, will defer to SNF for DME.     Follow Up Recommendations SNF    Equipment Recommendations  None recommended by PT       Precautions / Restrictions Precautions Precautions: Fall Restrictions Weight Bearing Restrictions: No      Mobility  Bed Mobility Overal bed mobility: Needs Assistance Bed Mobility: Supine to Sit     Supine to sit: Min assist (for trunk)        Transfers Overall transfer level: Needs assistance Equipment used: Rolling walker (2 wheeled) Transfers: Sit to/from Stand Sit to Stand: Mod assist            Ambulation/Gait Ambulation/Gait assistance: Min assist Ambulation Distance (Feet): 5 Feet Assistive device: Rolling walker (2 wheeled) Gait Pattern/deviations: Step-through pattern;Decreased step length - right;Decreased step length - left;Trunk flexed   Gait velocity interpretation: Below normal speed for age/gender General Gait Details: Noted decreased activity tolerance with gait, requiring seated rest break after activity.      Balance Overall balance assessment: Needs assistance;History of Falls (Hx of 1 fall/year, though stumbles everyday) Sitting-balance support: Feet unsupported;No upper extremity supported Sitting balance-Leahy Scale: Good     Standing balance support: Bilateral upper extremity supported;During functional activity (On RW with min assist to maintain during amb) Standing balance-Leahy Scale: Fair  Pertinent Vitals/Pain No pain reported.     Home Living Family/patient expects to be discharged to:: Skilled nursing facility Living Arrangements: Alone               Additional Comments: Pt lives alone in  a single story home with 1 step to enter the back door.  Pt has a grandson and niece nearby who are available PRN.  DME : BSC, hospital bed, rollator, manual W/C    Prior Function Level of Independence: Independent with assistive device(s);Needs assistance   Gait / Transfers Assistance Needed: Pt reports she was mod (I) with bed mobility skills, transfers, and household amb with use of rollator.  No community amb distances.    ADL's / Homemaking Assistance Needed: Pt is mod (I) with dressing and feeding, though requires assistance with bathing from niece and uses meals on wheels for hot meals (pt is able to make sandwiches for herself)           Extremity/Trunk Assessment               Lower Extremity Assessment: Generalized weakness         Communication   Communication: No difficulties  Cognition Arousal/Alertness: Awake/alert Behavior During Therapy: WFL for tasks assessed/performed Overall Cognitive Status: Within Functional Limits for tasks assessed (Oriented to self, date, location)                        Assessment/Plan    PT Assessment Patient needs continued PT services  PT Diagnosis Difficulty walking;Generalized weakness   PT Problem List Decreased strength;Decreased activity tolerance;Decreased balance;Decreased safety awareness;Decreased mobility  PT Treatment Interventions Balance training;Gait training;Neuromuscular re-education;Stair training;Functional mobility training;Therapeutic activities;Therapeutic exercise;Patient/family education   PT Goals (Current goals can be found in the Care Plan section) Acute Rehab PT Goals Patient Stated Goal: Get stronger PT Goal Formulation: With patient/family Time For Goal Achievement: 02/17/14 Potential to Achieve Goals: Good    Frequency Min 3X/week   Barriers to discharge Decreased caregiver support Pt lives alone and will not have 24/7 supervision at discharge, family available PRN       End of  Session Equipment Utilized During Treatment: Gait belt;Oxygen Activity Tolerance: Patient limited by fatigue Patient left: in chair;with call bell/phone within reach;with chair alarm set           Time: [id number] PT Time Calculation (min): 22 min   Charges:   PT Evaluation $Initial PT Evaluation Tier I: 1 Procedure          Noela Brothers 02/03/2014, 8:59 AM

## 2014-02-03 NOTE — Care Management Note (Signed)
    Page 1 of 1   02/04/2014     8:09:58 AM CARE MANAGEMENT NOTE 02/04/2014  Patient:  Carla Ward, Carla Ward   Account Number:  [id number]  Date Initiated:  02/03/2014  Documentation initiated by:  Anibal Henderson  Subjective/Objective Assessment:   admitted with +MI. Pt is from home, and  lives alone, with family available if needed. She will be going to [facility] for rehab, per PT recommendation, due to deconditioning     Action/Plan:   CSW aware and  working on placement   Anticipated DC Date:  02/04/2014   Anticipated DC Plan:  SKILLED NURSING FACILITY  In-house referral  Clinical Social Worker      DC Planning Services  CM consult      Choice offered to / List presented to:             Status of service:  Completed, signed off Medicare Important Message given?  YES (If response is "NO", the following Medicare IM given date fields will be blank) Date Medicare IM given:  02/04/2014 Medicare IM given by:  Sharrie Rothman Date Additional Medicare IM given:   Additional Medicare IM given by:    Discharge Disposition:  SKILLED NURSING FACILITY  Per UR Regulation:  Reviewed for med. necessity/level of care/duration of stay  If discussed at Long Length of Stay Meetings, dates discussed:    Comments:  02/04/14 0810 Arlyss Queen, RN BSN CM Pt discharged to [facility] today. CSW to arrange discharge to facility.  02/03/14 1500 Anibal Henderson RN/CM

## 2014-02-03 NOTE — Clinical Social Work Placement (Signed)
Clinical Social Work Department CLINICAL SOCIAL WORK PLACEMENT NOTE 02/03/2014  Patient:  SEMIRA, SIEGMANN  Account Number:  [id number] Admit date:  01/30/2014  Clinical Social Worker:  Derenda Fennel, LCSW  Date/time:  02/03/2014 10:25 AM  Clinical Social Work is seeking post-discharge placement for this patient at the following level of care:   SKILLED NURSING   (*CSW will update this form in Epic as items are completed)   02/03/2014  Patient/family provided with [facility] Department of Clinical Social Work's list of facilities offering this level of care within the geographic area requested by the patient (or if unable, by the patient's family).  02/03/2014  Patient/family informed of their freedom to choose among providers that offer the needed level of care, that participate in Medicare, Medicaid or managed care program needed by the patient, have an available bed and are willing to accept the patient.  02/03/2014  Patient/family informed of [facility]' ownership interest in [facility], as well as of the fact that they are under no obligation to receive care at this facility.  PASARR submitted to EDS on 02/03/2014 PASARR number received on 02/03/2014  FL2 transmitted to all facilities in geographic area requested by pt/family on  02/03/2014 FL2 transmitted to all facilities within larger geographic area on   Patient informed that his/her managed care company has contracts with or will negotiate with  certain facilities, including the following:     Patient/family informed of bed offers received:   Patient chooses bed at  Physician recommends and patient chooses bed at    Patient to be transferred to  on   Patient to be transferred to facility by  Patient and family notified of transfer on  Name of family member notified:    The following physician request were entered in Epic:   Additional Comments:  Derenda Fennel, LCSW [phone]

## 2014-02-03 NOTE — Progress Notes (Signed)
Subjective: Carla Ward is a denies any chest pain. Oxygen saturation dropped into the 70s off of oxygen yesterday necessitating continued oxygen by nasal cannula at 4 L. She is weak on her feet. Case has been discussed with her son. Skilled nursing is desired at discharge.  Objective: Vital signs in last 24 hours: Filed Vitals:   02/02/14 1338 02/02/14 2131 02/03/14 0500 02/03/14 0654  BP: 100/78 123/76  133/84  Pulse: 81 88  75  Temp: 97.8 F (36.6 C) 98.7 F (37.1 C)    TempSrc: Oral Oral    Resp: 20 20  20   Height:      Weight:   136 lb 12.8 oz (62.052 kg)   SpO2: 90% 98%  95%   Weight change: -9.6 oz (-0.272 kg)  Intake/Output Summary (Last 24 hours) at 02/03/14 0749 Last data filed at 02/03/14 0654  Gross per 24 hour  Intake    720 ml  Output    650 ml  Net     70 ml    Physical Exam: Alert and comfortable appearing. Lungs reveal basilar rales. Heart regular with frequent ectopy and a grade 2 systolic murmur. Abdomen soft and nontender. Extremities reveal no edema.  Lab Results:   No results found for this or any previous visit (from the past 24 hour(s)).   ABGS No results found for this basename: PHART, PCO2, PO2ART, TCO2, HCO3,  in the last 72 hours CULTURES Recent Results (from the past 240 hour(s))  MRSA PCR SCREENING     Status: None   Collection Time    01/30/14 11:20 AM      Result Value Ref Range Status   MRSA by PCR NEGATIVE  NEGATIVE Final   Comment:            The GeneXpert MRSA Assay (FDA     approved for NASAL specimens     only), is one component of a     comprehensive MRSA colonization     surveillance program. It is not     intended to diagnose MRSA     infection nor to guide or     monitor treatment for     MRSA infections.   Studies/Results: No results found. Micro Results: Recent Results (from the past 240 hour(s))  MRSA PCR SCREENING     Status: None   Collection Time    01/30/14 11:20 AM      Result Value Ref Range Status   MRSA by PCR NEGATIVE  NEGATIVE Final   Comment:            The GeneXpert MRSA Assay (FDA     approved for NASAL specimens     only), is one component of a     comprehensive MRSA colonization     surveillance program. It is not     intended to diagnose MRSA     infection nor to guide or     monitor treatment for     MRSA infections.   Studies/Results: No results found. Medications:  I have reviewed the patient's current medications Scheduled Meds: . antiseptic oral rinse  15 mL Mouth Rinse BID  . aspirin  325 mg Oral Daily  . atorvastatin  40 mg Oral q1800  . doxycycline  100 mg Oral Q12H  . enoxaparin (LOVENOX) injection  40 mg Subcutaneous Q24H  . furosemide  40 mg Oral Daily  . losartan  100 mg Oral Daily  . metoprolol succinate  25 mg Oral Once  .  metoprolol succinate  50 mg Oral Daily  . potassium chloride  20 mEq Oral TID  . spironolactone  12.5 mg Oral Daily   Continuous Infusions:  PRN Meds:.acetaminophen, ketoconazole, LORazepam, nitroGLYCERIN, ondansetron (ZOFRAN) IV   Assessment/Plan: #1. Anteroseptal MI. Congestive heart failure. Continue Lasix and spinal lactone. Continue Toprol-XL losartan, aspirin and atorvastatin. Consult physical therapy today. Continue supplemental oxygen for now. #2. Diabetes. Stable control without oral medications. #3. Dementia. Calm and cooperative now. #4. Hypertension. Blood pressure is low normal. Principal Problem:   Acute MI Active Problems:   Acute CHF   Hypertension   Edema of both legs     LOS: 4 days   Levana Minetti 02/03/2014, 7:49 AM

## 2014-02-03 NOTE — Clinical Social Work Note (Signed)
CSW presented bed offers and pt's son chooses [facility]. Facility notified. Awaiting stability for d/c.  Derenda Fennel, LCSW [phone]

## 2014-02-04 ENCOUNTER — Inpatient Hospital Stay
Admission: RE | Admit: 2014-02-04 | Discharge: 2014-02-04 | Disposition: A | Discharge status: Other Acute Inpatient Hospital | Payer: Medicare Other | Source: Ambulatory Visit | Attending: Internal Medicine | Admitting: Internal Medicine

## 2014-02-04 ENCOUNTER — Encounter (HOSPITAL_COMMUNITY): Payer: Self-pay | Admitting: Emergency Medicine

## 2014-02-04 ENCOUNTER — Inpatient Hospital Stay (HOSPITAL_COMMUNITY)
Admission: EM | Admit: 2014-02-04 | Discharge: 2014-02-09 | DRG: 291 | Disposition: A | Discharge status: Skilled Nursing Facility | Payer: Medicare Other | Attending: Internal Medicine | Admitting: Internal Medicine

## 2014-02-04 DIAGNOSIS — E785 Hyperlipidemia, unspecified: Secondary | ICD-10-CM | POA: Diagnosis present

## 2014-02-04 DIAGNOSIS — I2589 Other forms of chronic ischemic heart disease: Secondary | ICD-10-CM | POA: Diagnosis present

## 2014-02-04 DIAGNOSIS — E118 Type 2 diabetes mellitus with unspecified complications: Secondary | ICD-10-CM

## 2014-02-04 DIAGNOSIS — IMO0002 Reserved for concepts with insufficient information to code with codable children: Secondary | ICD-10-CM

## 2014-02-04 DIAGNOSIS — Z66 Do not resuscitate: Secondary | ICD-10-CM | POA: Diagnosis present

## 2014-02-04 DIAGNOSIS — B372 Candidiasis of skin and nail: Secondary | ICD-10-CM | POA: Diagnosis present

## 2014-02-04 DIAGNOSIS — Z79899 Other long term (current) drug therapy: Secondary | ICD-10-CM

## 2014-02-04 DIAGNOSIS — I10 Essential (primary) hypertension: Secondary | ICD-10-CM

## 2014-02-04 DIAGNOSIS — I509 Heart failure, unspecified: Secondary | ICD-10-CM | POA: Diagnosis present

## 2014-02-04 DIAGNOSIS — R600 Localized edema: Secondary | ICD-10-CM | POA: Diagnosis present

## 2014-02-04 DIAGNOSIS — B369 Superficial mycosis, unspecified: Secondary | ICD-10-CM | POA: Diagnosis present

## 2014-02-04 DIAGNOSIS — I5021 Acute systolic (congestive) heart failure: Secondary | ICD-10-CM

## 2014-02-04 DIAGNOSIS — J9600 Acute respiratory failure, unspecified whether with hypoxia or hypercapnia: Secondary | ICD-10-CM | POA: Diagnosis not present

## 2014-02-04 DIAGNOSIS — J4489 Other specified chronic obstructive pulmonary disease: Secondary | ICD-10-CM | POA: Diagnosis present

## 2014-02-04 DIAGNOSIS — I255 Ischemic cardiomyopathy: Secondary | ICD-10-CM | POA: Diagnosis present

## 2014-02-04 DIAGNOSIS — J449 Chronic obstructive pulmonary disease, unspecified: Secondary | ICD-10-CM | POA: Diagnosis present

## 2014-02-04 DIAGNOSIS — I5023 Acute on chronic systolic (congestive) heart failure: Secondary | ICD-10-CM | POA: Diagnosis present

## 2014-02-04 DIAGNOSIS — B379 Candidiasis, unspecified: Secondary | ICD-10-CM | POA: Diagnosis present

## 2014-02-04 DIAGNOSIS — Z7982 Long term (current) use of aspirin: Secondary | ICD-10-CM

## 2014-02-04 DIAGNOSIS — I2510 Atherosclerotic heart disease of native coronary artery without angina pectoris: Secondary | ICD-10-CM | POA: Diagnosis present

## 2014-02-04 DIAGNOSIS — E119 Type 2 diabetes mellitus without complications: Secondary | ICD-10-CM

## 2014-02-04 DIAGNOSIS — F0390 Unspecified dementia without behavioral disturbance: Secondary | ICD-10-CM

## 2014-02-04 DIAGNOSIS — I350 Nonrheumatic aortic (valve) stenosis: Secondary | ICD-10-CM | POA: Diagnosis present

## 2014-02-04 DIAGNOSIS — I2109 ST elevation (STEMI) myocardial infarction involving other coronary artery of anterior wall: Secondary | ICD-10-CM | POA: Diagnosis present

## 2014-02-04 DIAGNOSIS — I359 Nonrheumatic aortic valve disorder, unspecified: Secondary | ICD-10-CM | POA: Diagnosis present

## 2014-02-04 HISTORY — DX: Unspecified dementia, unspecified severity, without behavioral disturbance, psychotic disturbance, mood disturbance, and anxiety: F03.90

## 2014-02-04 HISTORY — DX: Edema, unspecified: R60.9

## 2014-02-04 HISTORY — DX: Chronic obstructive pulmonary disease, unspecified: J44.9

## 2014-02-04 HISTORY — DX: Type 2 diabetes mellitus without complications: E11.9

## 2014-02-04 LAB — BASIC METABOLIC PANEL
ANION GAP: 8 (ref 5–15)
BUN: 26 mg/dL — AB (ref 6–23)
CALCIUM: 8.5 mg/dL (ref 8.4–10.5)
CO2: 30 meq/L (ref 19–32)
CREATININE: 0.68 mg/dL (ref 0.50–1.10)
Chloride: 104 mEq/L (ref 96–112)
GFR calc Af Amer: 82 mL/min — ABNORMAL LOW (ref >90)
GFR calc non Af Amer: 71 mL/min — ABNORMAL LOW (ref >90)
Glucose, Bld: 114 mg/dL — ABNORMAL HIGH (ref 70–99)
Potassium: 4 mEq/L (ref 3.7–5.3)
Sodium: 142 mEq/L (ref 137–147)

## 2014-02-04 MED ORDER — METOPROLOL SUCCINATE ER 50 MG PO TB24: 50.0000 mg | ORAL_TABLET | Freq: Every day | ORAL | Status: AC

## 2014-02-04 MED ORDER — KETOCONAZOLE 2 % EX CREA: TOPICAL_CREAM | Freq: Two times a day (BID) | CUTANEOUS | Status: AC | PRN

## 2014-02-04 MED ORDER — ATORVASTATIN CALCIUM 40 MG PO TABS: 40.0000 mg | ORAL_TABLET | Freq: Every day | ORAL | Status: AC

## 2014-02-04 MED ORDER — DOXYCYCLINE HYCLATE 100 MG PO TABS: 100.0000 mg | ORAL_TABLET | Freq: Two times a day (BID) | ORAL | Status: DC

## 2014-02-04 MED ORDER — SPIRONOLACTONE 12.5 MG HALF TABLET: 12.5000 mg | ORAL_TABLET | Freq: Every day | ORAL | Status: DC

## 2014-02-04 NOTE — Progress Notes (Signed)
UR chart review completed.  

## 2014-02-04 NOTE — ED Notes (Addendum)
Per EMS pt. From [facility] nursing center. Pt. Shortness of breath starting tonight. Pt. Denies pain. Per EMS pt. Was 86% on room air on scene. Pt. Was placed on 15L.

## 2014-02-04 NOTE — Discharge Summary (Signed)
Physician Discharge Summary  Carla Ward NHA:[id number] DOB: 10-28-1914 DOA: 01/30/2014   Admit date: 01/30/2014 Discharge date: 02/04/2014  Discharge Diagnoses: #1. Acute anterolateral MI #2. Acute systolic congestive heart failure #3 aortic stenosis #4. Type 2 diabetes #5. Hypertension #6. Dementia #7. Chronic edema Principal Problem:   Acute MI Active Problems:   Acute CHF   Hypertension   Edema of both legs    Wt Readings from Last 3 Encounters:  02/04/14 139 lb 1.6 oz (63.095 kg)     Hospital Course:  This patient is a 80 year old female who presented to the emergency room after experiencing substernal chest pain. She was found to have ST segment elevation in the anterolateral leads consistent with an acute myocardial infarction. Her troponin peak at 2.1. She developed acute systolic congestive heart failure. Her echo revealed akinesis of the anterolateral wall with an ejection fraction of 20-25%. She also has moderate to severe aortic stenosis. She was seen in consultation by cardiology. She was treated with metoprolol, aspirin, atorvastatin, Lasix and spironolactone. A DO NOT RESUSCITATE orders in place. She was felt to be too weak to return home and is being discharged to skilled nursing. She's had intermittent confusion consistent with her underlying dementia. She has a history of hypertension and diabetes. She is also on losartan daily. She has not required an oral agent for glucose control.   electrolytes are in the normal range. She has been treated with supplemental potassium for mild hypokalemia. Potassium is now 4.0. Renal function is stable.    She has been evaluated and treated by physical therapy. She remains on supplemental oxygen at 2 L by nasal cannula. Oxygen saturations are in the high 90s.  She has been treated with doxycycline and ketoconazole for recent skin rash. These were continued. Will continue doxycycline for 7 more days and ketoconazole for 14  days.  Please obtain a BMP in one week.   Discharge Instructions  Discharge Instructions   For home use only DME oxygen    Complete by:  As directed   Mode or (Route):  Nasal cannula  Liters per Minute:  2  Oxygen delivery system:  Gas            Medication List         aspirin EC 81 MG tablet  Take 81 mg by mouth daily.     atorvastatin 40 MG tablet  Commonly known as:  LIPITOR  Take 1 tablet (40 mg total) by mouth daily at 6 PM.     doxycycline 100 MG tablet  Commonly known as:  VIBRA-TABS  Take 1 tablet (100 mg total) by mouth every 12 (twelve) hours.     furosemide 40 MG tablet  Commonly known as:  LASIX  Take 40 mg by mouth daily.     ketoconazole 2 % cream  Commonly known as:  NIZORAL  Apply topically 2 (two) times daily as needed for irritation.     losartan 100 MG tablet  Commonly known as:  COZAAR  Take by mouth daily.     metoprolol succinate 50 MG 24 hr tablet  Commonly known as:  TOPROL-XL  Take 1 tablet (50 mg total) by mouth daily. Take with or immediately following a meal.     multivitamin with minerals Tabs tablet  Take 1 tablet by mouth daily.     potassium chloride SA 20 MEQ tablet  Commonly known as:  K-DUR,KLOR-CON  Take 20 mEq by mouth daily.  spironolactone 12.5 mg Tabs tablet  Commonly known as:  ALDACTONE  Take 0.5 tablets (12.5 mg total) by mouth daily.         Carla Ward 02/04/2014

## 2014-02-04 NOTE — Clinical Social Work Placement (Signed)
Clinical Social Work Department CLINICAL SOCIAL WORK PLACEMENT NOTE 02/04/2014  Patient:  Carla Ward, Carla Ward  Account Number:  [id number] Admit date:  01/30/2014  Clinical Social Worker:  Derenda Fennel, LCSW  Date/time:  02/03/2014 10:25 AM  Clinical Social Work is seeking post-discharge placement for this patient at the following level of care:   SKILLED NURSING   (*CSW will update this form in Epic as items are completed)   02/03/2014  Patient/family provided with [facility] Department of Clinical Social Work's list of facilities offering this level of care within the geographic area requested by the patient (or if unable, by the patient's family).  02/03/2014  Patient/family informed of their freedom to choose among providers that offer the needed level of care, that participate in Medicare, Medicaid or managed care program needed by the patient, have an available bed and are willing to accept the patient.  02/03/2014  Patient/family informed of [facility]' ownership interest in [facility], as well as of the fact that they are under no obligation to receive care at this facility.  PASARR submitted to EDS on 02/03/2014 PASARR number received on 02/03/2014  FL2 transmitted to all facilities in geographic area requested by pt/family on  02/03/2014 FL2 transmitted to all facilities within larger geographic area on   Patient informed that his/her managed care company has contracts with or will negotiate with  certain facilities, including the following:     Patient/family informed of bed offers received:  02/03/2014 Patient chooses bed at [facility] Physician recommends and patient chooses bed at  [facility]  Patient to be transferred to [facility] on  02/04/2014 Patient to be transferred to facility by RN Patient and family notified of transfer on 02/04/2014 Name of family member notified:  Robert-son  The following physician request were entered  in Epic:   Additional Comments:  Derenda Fennel, LCSW [phone]

## 2014-02-04 NOTE — Progress Notes (Signed)
Pt discharged to [facility] today per Dr. Ouida Sills. Pt's IV site D/C'd and WNL. Pt's VSS. Report called to Vickie, nurse at [facility]. Answered questions accordingly. Pt left floor via WC in stable condition accompanied by NT.

## 2014-02-04 NOTE — Clinical Social Work Note (Signed)
Pt d/c today to [facility]. Pt's son, Molly Maduro and Lieutenant Diego at facility aware and agreeable. D/C summary faxed. Pt to transfer with RN.  Derenda Fennel, [place] [phone]

## 2014-02-05 ENCOUNTER — Encounter (HOSPITAL_COMMUNITY): Payer: Self-pay | Admitting: Internal Medicine

## 2014-02-05 ENCOUNTER — Emergency Department (HOSPITAL_COMMUNITY): Payer: Medicare Other

## 2014-02-05 DIAGNOSIS — I10 Essential (primary) hypertension: Secondary | ICD-10-CM

## 2014-02-05 DIAGNOSIS — E119 Type 2 diabetes mellitus without complications: Secondary | ICD-10-CM

## 2014-02-05 DIAGNOSIS — I359 Nonrheumatic aortic valve disorder, unspecified: Secondary | ICD-10-CM

## 2014-02-05 DIAGNOSIS — Z66 Do not resuscitate: Secondary | ICD-10-CM | POA: Diagnosis present

## 2014-02-05 DIAGNOSIS — I509 Heart failure, unspecified: Secondary | ICD-10-CM

## 2014-02-05 DIAGNOSIS — R609 Edema, unspecified: Secondary | ICD-10-CM

## 2014-02-05 DIAGNOSIS — I5023 Acute on chronic systolic (congestive) heart failure: Principal | ICD-10-CM

## 2014-02-05 DIAGNOSIS — I5021 Acute systolic (congestive) heart failure: Secondary | ICD-10-CM

## 2014-02-05 DIAGNOSIS — E118 Type 2 diabetes mellitus with unspecified complications: Secondary | ICD-10-CM

## 2014-02-05 DIAGNOSIS — I350 Nonrheumatic aortic (valve) stenosis: Secondary | ICD-10-CM | POA: Diagnosis present

## 2014-02-05 DIAGNOSIS — B379 Candidiasis, unspecified: Secondary | ICD-10-CM

## 2014-02-05 DIAGNOSIS — I2589 Other forms of chronic ischemic heart disease: Secondary | ICD-10-CM

## 2014-02-05 DIAGNOSIS — I255 Ischemic cardiomyopathy: Secondary | ICD-10-CM | POA: Diagnosis present

## 2014-02-05 DIAGNOSIS — F0390 Unspecified dementia without behavioral disturbance: Secondary | ICD-10-CM

## 2014-02-05 DIAGNOSIS — I219 Acute myocardial infarction, unspecified: Secondary | ICD-10-CM

## 2014-02-05 LAB — GLUCOSE, CAPILLARY
GLUCOSE-CAPILLARY: 106 mg/dL — AB (ref 70–99)
Glucose-Capillary: 124 mg/dL — ABNORMAL HIGH (ref 70–99)
Glucose-Capillary: 136 mg/dL — ABNORMAL HIGH (ref 70–99)
Glucose-Capillary: 143 mg/dL — ABNORMAL HIGH (ref 70–99)

## 2014-02-05 LAB — COMPREHENSIVE METABOLIC PANEL
ALT: 9 U/L (ref 0–35)
ANION GAP: 11 (ref 5–15)
AST: 18 U/L (ref 0–37)
Albumin: 2.7 g/dL — ABNORMAL LOW (ref 3.5–5.2)
Alkaline Phosphatase: 60 U/L (ref 39–117)
BILIRUBIN TOTAL: 0.3 mg/dL (ref 0.3–1.2)
BUN: 30 mg/dL — AB (ref 6–23)
CHLORIDE: 101 meq/L (ref 96–112)
CO2: 27 meq/L (ref 19–32)
Calcium: 9 mg/dL (ref 8.4–10.5)
Creatinine, Ser: 0.8 mg/dL (ref 0.50–1.10)
GFR, EST AFRICAN AMERICAN: 69 mL/min — AB (ref >90)
GFR, EST NON AFRICAN AMERICAN: 60 mL/min — AB (ref >90)
GLUCOSE: 136 mg/dL — AB (ref 70–99)
Potassium: 4.5 mEq/L (ref 3.7–5.3)
SODIUM: 139 meq/L (ref 137–147)
Total Protein: 6.3 g/dL (ref 6.0–8.3)

## 2014-02-05 LAB — CBC
HEMATOCRIT: 36.9 % (ref 36.0–46.0)
HEMOGLOBIN: 12.5 g/dL (ref 12.0–15.0)
MCH: 33.5 pg (ref 26.0–34.0)
MCHC: 33.9 g/dL (ref 30.0–36.0)
MCV: 98.9 fL (ref 78.0–100.0)
Platelets: 170 10*3/uL (ref 150–400)
RBC: 3.73 MIL/uL — ABNORMAL LOW (ref 3.87–5.11)
RDW: 14.3 % (ref 11.5–15.5)
WBC: 8.6 10*3/uL (ref 4.0–10.5)

## 2014-02-05 LAB — TROPONIN I: Troponin I: 0.62 ng/mL (ref <0.30)

## 2014-02-05 LAB — PROTIME-INR
INR: 1.13 (ref 0.00–1.49)
PROTHROMBIN TIME: 14.5 s (ref 11.6–15.2)

## 2014-02-05 LAB — PRO B NATRIURETIC PEPTIDE: Pro B Natriuretic peptide (BNP): 19583 pg/mL — ABNORMAL HIGH (ref 0–450)

## 2014-02-05 MED ORDER — SODIUM CHLORIDE 0.9 % IJ SOLN
3.0000 mL | Freq: Two times a day (BID) | INTRAMUSCULAR | Status: DC
  Administered 2014-02-05 – 2014-02-09 (×9): 3 mL via INTRAVENOUS

## 2014-02-05 MED ORDER — ENOXAPARIN SODIUM 30 MG/0.3ML ~~LOC~~ SOLN
30.0000 mg | SUBCUTANEOUS | Status: DC
  Administered 2014-02-05 – 2014-02-07 (×3): 30 mg via SUBCUTANEOUS
  Filled 2014-02-05 (×3): qty 0.3

## 2014-02-05 MED ORDER — ATORVASTATIN CALCIUM 40 MG PO TABS
40.0000 mg | ORAL_TABLET | Freq: Every day | ORAL | Status: DC
  Administered 2014-02-05 – 2014-02-07 (×3): 40 mg via ORAL
  Filled 2014-02-05 (×4): qty 1

## 2014-02-05 MED ORDER — DOXYCYCLINE HYCLATE 100 MG PO TABS
100.0000 mg | ORAL_TABLET | Freq: Two times a day (BID) | ORAL | Status: DC
  Administered 2014-02-05 – 2014-02-09 (×9): 100 mg via ORAL
  Filled 2014-02-05 (×9): qty 1

## 2014-02-05 MED ORDER — FUROSEMIDE 10 MG/ML IJ SOLN
40.0000 mg | Freq: Once | INTRAMUSCULAR | Status: AC
  Administered 2014-02-05: 40 mg via INTRAVENOUS
  Filled 2014-02-05: qty 4

## 2014-02-05 MED ORDER — METOPROLOL TARTRATE 25 MG PO TABS
25.0000 mg | ORAL_TABLET | Freq: Two times a day (BID) | ORAL | Status: DC
  Administered 2014-02-05 – 2014-02-06 (×4): 25 mg via ORAL
  Filled 2014-02-05 (×5): qty 1

## 2014-02-05 MED ORDER — FUROSEMIDE 10 MG/ML IJ SOLN: 40.0000 mg | Freq: Three times a day (TID) | INTRAMUSCULAR | Status: DC

## 2014-02-05 MED ORDER — INSULIN ASPART 100 UNIT/ML ~~LOC~~ SOLN
0.0000 [IU] | Freq: Three times a day (TID) | SUBCUTANEOUS | Status: DC
  Administered 2014-02-06 – 2014-02-09 (×6): 1 [IU] via SUBCUTANEOUS

## 2014-02-05 MED ORDER — SPIRONOLACTONE 25 MG PO TABS
12.5000 mg | ORAL_TABLET | Freq: Every day | ORAL | Status: DC
  Administered 2014-02-05 – 2014-02-09 (×5): 12.5 mg via ORAL
  Filled 2014-02-05 (×5): qty 1

## 2014-02-05 MED ORDER — ASPIRIN EC 81 MG PO TBEC
81.0000 mg | DELAYED_RELEASE_TABLET | Freq: Every day | ORAL | Status: DC
  Administered 2014-02-05 – 2014-02-09 (×5): 81 mg via ORAL
  Filled 2014-02-05 (×5): qty 1

## 2014-02-05 MED ORDER — POTASSIUM CHLORIDE CRYS ER 20 MEQ PO TBCR
20.0000 meq | EXTENDED_RELEASE_TABLET | Freq: Two times a day (BID) | ORAL | Status: DC
  Administered 2014-02-05: 20 meq via ORAL

## 2014-02-05 MED ORDER — ONDANSETRON HCL 4 MG PO TABS: 4.0000 mg | ORAL_TABLET | Freq: Four times a day (QID) | ORAL | Status: DC | PRN

## 2014-02-05 MED ORDER — FLUCONAZOLE 100 MG PO TABS
100.0000 mg | ORAL_TABLET | Freq: Every day | ORAL | Status: DC
  Administered 2014-02-05 – 2014-02-09 (×5): 100 mg via ORAL
  Filled 2014-02-05 (×6): qty 1

## 2014-02-05 MED ORDER — INSULIN ASPART 100 UNIT/ML ~~LOC~~ SOLN: 0.0000 [IU] | Freq: Every day | SUBCUTANEOUS | Status: DC

## 2014-02-05 MED ORDER — BUMETANIDE 0.25 MG/ML IJ SOLN
0.5000 mg | Freq: Two times a day (BID) | INTRAMUSCULAR | Status: DC
  Administered 2014-02-05: 0.5 mg via INTRAVENOUS
  Filled 2014-02-05: qty 4

## 2014-02-05 MED ORDER — ONDANSETRON HCL 4 MG/2ML IJ SOLN: 4.0000 mg | Freq: Four times a day (QID) | INTRAMUSCULAR | Status: DC | PRN

## 2014-02-05 MED ORDER — ACETAMINOPHEN 325 MG PO TABS
650.0000 mg | ORAL_TABLET | ORAL | Status: DC | PRN
  Administered 2014-02-05 – 2014-02-07 (×2): 650 mg via ORAL
  Filled 2014-02-05 (×2): qty 2

## 2014-02-05 MED ORDER — FUROSEMIDE 10 MG/ML IJ SOLN
40.0000 mg | Freq: Two times a day (BID) | INTRAMUSCULAR | Status: DC
  Administered 2014-02-05: 40 mg via INTRAMUSCULAR
  Filled 2014-02-05: qty 4

## 2014-02-05 MED ORDER — FUROSEMIDE 10 MG/ML IJ SOLN
40.0000 mg | Freq: Two times a day (BID) | INTRAMUSCULAR | Status: DC
  Administered 2014-02-05 – 2014-02-09 (×8): 40 mg via INTRAVENOUS
  Filled 2014-02-05 (×8): qty 4

## 2014-02-05 MED ORDER — POTASSIUM CHLORIDE CRYS ER 20 MEQ PO TBCR
20.0000 meq | EXTENDED_RELEASE_TABLET | Freq: Every day | ORAL | Status: DC
  Administered 2014-02-05 – 2014-02-07 (×3): 20 meq via ORAL
  Filled 2014-02-05 (×4): qty 1

## 2014-02-05 MED ORDER — NITROGLYCERIN 0.4 MG SL SUBL
SUBLINGUAL_TABLET | SUBLINGUAL | Status: AC
Start: 2014-02-05 — End: 2014-02-06
  Filled 2014-02-05: qty 1

## 2014-02-05 MED ORDER — INSULIN ASPART 100 UNIT/ML ~~LOC~~ SOLN
0.0000 [IU] | SUBCUTANEOUS | Status: DC
  Administered 2014-02-05 (×3): 1 [IU] via SUBCUTANEOUS

## 2014-02-05 NOTE — Progress Notes (Signed)
Pt c/o severe chest pain bilaterally. VSS. Dr. Ouida Sills called and new orders received for 12 lead EKG and Tylenol 650 mg PO q 4 hours PRN.

## 2014-02-05 NOTE — ED Provider Notes (Signed)
CSN: [id number]     Arrival date & time 02/04/14  2352 History   First MD Initiated Contact with Patient 02/04/14 2353     Chief Complaint  Patient presents with  . Shortness of Breath     (Consider location/radiation/quality/duration/timing/severity/associated sxs/prior Treatment) HPI Comments: The patient is a 80 year old female with a recent history of ST elevation myocardial infarction, new onset of congestive heart failure. She was seen and released from the hospital and has been living at her nursing facility since that time. Nursing reports that this evening she developed increased shortness of breath and was found to be hypoxic, given supplemental oxygen but only achieved 86% saturation prior to arrival. Paramedics noted 86%, supplied nonrebreather oxygen at 100% with good response and the patient states that she is not short of breath at this time. Of note the patient does have dementia and has no recollection of being short of breath prior to arrival.  Patient is a 80 y.o. female presenting with shortness of breath. The history is provided by the patient, the EMS personnel and medical records.  Shortness of Breath   Past Medical History  Diagnosis Date  . Hypertension   . Heart failure   . Aortic valve disorder   . Diabetes mellitus without complication   . Dementia   . Edema    Past Surgical History  Procedure Laterality Date  . Abdominal surgery     No family history on file. History  Substance Use Topics  . Smoking status: Never Smoker   . Smokeless tobacco: Not on file  . Alcohol Use: No   OB History   Grav Para Term Preterm Abortions TAB SAB Ect Mult Living                 Review of Systems  Unable to perform ROS: Dementia  Respiratory: Positive for shortness of breath.       Allergies  Review of patient's allergies indicates no known allergies.  Home Medications   Prior to Admission medications   Medication Sig Start Date End Date Taking?  Authorizing Provider  aspirin EC 81 MG tablet Take 81 mg by mouth daily.   Yes Historical Provider, MD  atorvastatin (LIPITOR) 40 MG tablet Take 1 tablet (40 mg total) by mouth daily at 6 PM. 02/04/14  Yes Carylon Perchesoy Fagan, MD  doxycycline (VIBRA-TABS) 100 MG tablet Take 1 tablet (100 mg total) by mouth every 12 (twelve) hours. 02/04/14  Yes Carylon Perchesoy Fagan, MD  furosemide (LASIX) 40 MG tablet Take 40 mg by mouth daily.   Yes Historical Provider, MD  ketoconazole (NIZORAL) 2 % cream Apply topically 2 (two) times daily as needed for irritation. 02/04/14  Yes Carylon Perchesoy Fagan, MD  losartan (COZAAR) 100 MG tablet Take by mouth daily.   Yes Historical Provider, MD  metoprolol succinate (TOPROL-XL) 50 MG 24 hr tablet Take 1 tablet (50 mg total) by mouth daily. Take with or immediately following a meal. 02/04/14  Yes Carylon Perchesoy Fagan, MD  Multiple Vitamin (MULTIVITAMIN WITH MINERALS) TABS tablet Take 1 tablet by mouth daily.   Yes Historical Provider, MD  potassium chloride SA (K-DUR,KLOR-CON) 20 MEQ tablet Take 20 mEq by mouth daily.   Yes Historical Provider, MD  spironolactone (ALDACTONE) 12.5 mg TABS tablet Take 0.5 tablets (12.5 mg total) by mouth daily. 02/04/14  Yes Carylon Perchesoy Fagan, MD   BP 113/54  Pulse 72  Temp(Src) 98.6 F (37 C) (Rectal)  Resp 17  SpO2 92% Physical Exam  Nursing note and  vitals reviewed. Constitutional:  Mildly tachypneic  HENT:  Head: Normocephalic and atraumatic.  Mouth/Throat: Oropharynx is clear and moist. No oropharyngeal exudate.  Moist mucous membranes  Eyes: Conjunctivae and EOM are normal. Pupils are equal, round, and reactive to light. Right eye exhibits no discharge. Left eye exhibits no discharge. No scleral icterus.  Neck: Normal range of motion. Neck supple. No JVD present. No thyromegaly present.  Cardiovascular: Normal rate and intact distal pulses.  Exam reveals no gallop and no friction rub.   Murmur heard. Systolic murmur, ectopy auscultated, strong pulses at radial arteries, peripheral  edema present in the lower extremities below the knees symmetrical  Pulmonary/Chest: Effort normal. No respiratory distress. She has no wheezes. She has rales (soft rales at the bases right greater than left).  Abdominal: Soft. Bowel sounds are normal. She exhibits no distension and no mass. There is no tenderness.  Musculoskeletal: Normal range of motion. She exhibits edema (bilateral symmetrical lower extremity edema below the knees). She exhibits no tenderness.  Lymphadenopathy:    She has no cervical adenopathy.  Neurological: She is alert. Coordination normal.  Skin: Skin is warm and dry. Rash noted. There is erythema.  Bilateral breast tissue with fungal rash, flaking of the skin and redness of the skin. No induration or fluctuance is present, right periareolar tissue with increased redness, patient denies any tenderness.  Psychiatric: She has a normal mood and affect. Her behavior is normal.    ED Course  Procedures (including critical care time) Labs Review Labs Reviewed  CBC - Abnormal; Notable for the following:    RBC 3.73 (*)    All other components within normal limits  PRO B NATRIURETIC PEPTIDE - Abnormal; Notable for the following:    Pro B Natriuretic peptide (BNP) 19583.0 (*)    All other components within normal limits  TROPONIN I - Abnormal; Notable for the following:    Troponin I 0.62 (*)    All other components within normal limits  COMPREHENSIVE METABOLIC PANEL - Abnormal; Notable for the following:    Glucose, Bld 136 (*)    BUN 30 (*)    Albumin 2.7 (*)    GFR calc non Af Amer 60 (*)    GFR calc Af Amer 69 (*)    All other components within normal limits  PROTIME-INR    Imaging Review Dg Chest Portable 1 View  02/05/2014   CLINICAL DATA:  Shortness of breath, dementia.  History of CHF.  EXAM: PORTABLE CHEST - 1 VIEW  COMPARISON:  Chest radiograph January 30, 2014  FINDINGS: The cardiac silhouette remains moderately enlarged, mediastinal silhouette is  nonsuspicious; calcified aortic knob. Increasing central pulmonary vascular congestion, interstitial prominence with bibasilar airspace opacities, small to moderate bilateral pleural effusions.  No pneumothorax. Patient is osteopenic. Multiple EKG lines overlie the patient and may obscure subtle underlying pathology.  IMPRESSION: Stable cardiomegaly, worsening interstitial and alveolar airspace opacities likely reflect pulmonary edema with increasing small to moderate bilateral pleural effusions.   Electronically Signed   By: Awilda Metro   On: 02/05/2014 00:32     EKG Interpretation   Date/Time:  Friday February 05 2014 00:06:50 EDT Ventricular Rate:  83 PR Interval:  144 QRS Duration: 80 QT Interval:  400 QTC Calculation: 470 R Axis:   -69 Text Interpretation:  Sinus rhythm Ventricular trigeminy Left anterior  fascicular block Anterior infarct, old Since last tracing ST elevations  are no longer seen, ectopy still present, Q waves persist Confirmed by  Dimetri Armitage  MD, Adelheid Hoggard ([id number]) on 02/05/2014 12:10:18 AM      MDM   Final diagnoses:  Acute systolic congestive heart failure    The patient is in mild increased respiratory effort, oxygen saturations 91% on room air, supplemental oxygen given, EKG pending, chest x-ray pending, labs, the patient does not appear in distress and is not require BiPAP or invasive ventilation at this time.  Ms. Rentie was reexamined several times, she remained mildly hypoxic but no tachycardia. Her laboratory workup showed a troponin of 0.6 which was not unexpected given her recent myocardial infarction in the last 10 days. Her chest x-ray showed significantly increased pulmonary edema, this was treated with Lasix intravenously. The patient was able to diurese a small amount but because of her ongoing hypoxia she was admitted to the hospital, after discussion with the hospitalist Dr. Conley Rolls.  Vida Roller, MD 02/05/14 [phone]

## 2014-02-05 NOTE — H&P (Signed)
[facility] History and Physical  Carla Ward KGO:[id number] DOB: 07/23/1915    PCP:   Carylon Perches, MD   Chief Complaint: shortness of breath.  HPI: Carla Ward is an 80 y.o. female with hx of ischemic cardiomyopathy, recently admitted for MI treated conservatively due to co-morbidities and advanced age, hx of HTN, hyperlipidemia, candida skin infection on chest, systolic congestive heart failure with EF 20 percent, DM, chronic lower extremity edema, presents to the ER again via EMS for increase shortness of breath and orthopnea.  She did not have fever, chills, or coughs.  Evalautuion with CHF showed pulmonary edema, with bilateral effusions, normal electrolytes and renal fx tests, and EKG showed Trigeminy rhythm with no acute ischemic changes.  Her troponin was 0.62, a drop from previous level of 1.53 six days ago.  She was given IV Lasix, and improved.  Hospitalist was asked to admit her for acute on chronic systolic CHF, in the setting of medical treatment ischemic CMP, and moderate AS.  Rewiew of Systems:  Constitutional: Negative for malaise, fever and chills. No significant weight loss or weight gain Eyes: Negative for eye pain, redness and discharge, diplopia, visual changes, or flashes of light. ENMT: Negative for ear pain, hoarseness, nasal congestion, sinus pressure and sore throat. No headaches; tinnitus, drooling, or problem swallowing. Cardiovascular: Negative for chest pain, palpitations, diaphoresis Respiratory: Negative for cough, hemoptysis, wheezing and stridor. No pleuritic chestpain. Gastrointestinal: Negative for nausea, vomiting, diarrhea, constipation, abdominal pain, melena, blood in stool, hematemesis, jaundice and rectal bleeding.    Genitourinary: Negative for frequency, dysuria, incontinence,flank pain and hematuria; Musculoskeletal: Negative for back pain and neck pain. Negative for swelling and trauma.;  Skin: . Negative for pruritus, rash, abrasions,  bruising and skin lesion.; ulcerations Neuro: Negative for headache, lightheadedness and neck stiffness. Negative for weakness, altered level of consciousness , altered mental status, extremity weakness, burning feet, involuntary movement, seizure and syncope.  Psych: negative for anxiety, depression, insomnia, tearfulness, panic attacks, hallucinations, paranoia, suicidal or homicidal ideation    Past Medical History  Diagnosis Date  . Hypertension   . Heart failure   . Aortic valve disorder   . Diabetes mellitus without complication   . Dementia   . Edema     Past Surgical History  Procedure Laterality Date  . Abdominal surgery      Medications:  HOME MEDS: Prior to Admission medications   Medication Sig Start Date End Date Taking? Authorizing Provider  aspirin EC 81 MG tablet Take 81 mg by mouth daily.   Yes Historical Provider, MD  atorvastatin (LIPITOR) 40 MG tablet Take 1 tablet (40 mg total) by mouth daily at 6 PM. 02/04/14  Yes Carylon Perches, MD  doxycycline (VIBRA-TABS) 100 MG tablet Take 1 tablet (100 mg total) by mouth every 12 (twelve) hours. 02/04/14  Yes Carylon Perches, MD  furosemide (LASIX) 40 MG tablet Take 40 mg by mouth daily.   Yes Historical Provider, MD  ketoconazole (NIZORAL) 2 % cream Apply topically 2 (two) times daily as needed for irritation. 02/04/14  Yes Carylon Perches, MD  losartan (COZAAR) 100 MG tablet Take by mouth daily.   Yes Historical Provider, MD  metoprolol succinate (TOPROL-XL) 50 MG 24 hr tablet Take 1 tablet (50 mg total) by mouth daily. Take with or immediately following a meal. 02/04/14  Yes Carylon Perches, MD  Multiple Vitamin (MULTIVITAMIN WITH MINERALS) TABS tablet Take 1 tablet by mouth daily.   Yes Historical Provider, MD  potassium chloride SA (K-DUR,KLOR-CON)  20 MEQ tablet Take 20 mEq by mouth daily.   Yes Historical Provider, MD  spironolactone (ALDACTONE) 12.5 mg TABS tablet Take 0.5 tablets (12.5 mg total) by mouth daily. 02/04/14  Yes Carylon Perches, MD      Allergies:  No Known Allergies  Social History:   reports that she has never smoked. She does not have any smokeless tobacco history on file. She reports that she does not drink alcohol or use illicit drugs.  Family History: No family history on file.   Physical Exam: Filed Vitals:   02/05/14 0017 02/05/14 0053 02/05/14 0100 02/05/14 0130  BP:  102/85 113/54 94/79  Pulse:  72  70  Temp: 98.6 F (37 C)     TempSrc: Rectal     Resp:  18 17 15   SpO2:  92%  95%   Blood pressure 94/79, pulse 70, temperature 98.6 F (37 C), temperature source Rectal, resp. rate 15, SpO2 95.00%.  GEN:  Pleasant patient lying in the stretcher in no acute distress; cooperative with exam. PSYCH:  alert and oriented x4; does not appear anxious or depressed; affect is appropriate. HEENT: Mucous membranes pink and anicteric; PERRLA; EOM intact; no cervical lymphadenopathy nor thyromegaly or carotid bruit; no JVD; There were no stridor. Neck is very supple. Breasts:: Not examined CHEST WALL: No tenderness CHEST: Normal respiration, scattered rhonchi, with no wheezing, but decreased BSs and rales at both bases. HEART: Regular rate and rhythm.  There are no murmur, rub, or gallops.   BACK: No kyphosis or scoliosis; no CVA tenderness ABDOMEN: soft and non-tender; no masses, no organomegaly, normal abdominal bowel sounds; no pannus; no intertriginous candida. There is no rebound and no distention. Rectal Exam: Not done EXTREMITIES: No bone or joint deformity; age-appropriate arthropathy of the hands and knees; 3+ edema, no ulcerations.  There is no calf tenderness. Genitalia: not examined PULSES: 2+ and symmetric SKIN: scaly and erythema, c/w candida skin infection. CNS: Cranial nerves 2-12 grossly intact no focal lateralizing neurologic deficit.  Speech is fluent; uvula elevated with phonation, facial symmetry and tongue midline. DTR are normal bilaterally, cerebella exam is intact, barbinski is negative  and strengths are equaled bilaterally.  No sensory loss.   Labs on Admission:  Basic Metabolic Panel:  Recent Labs Lab 01/31/14 0446 02/01/14 0442 02/02/14 0541 02/04/14 0553 02/05/14 0003  NA 142 143 141 142 139  K 4.0 3.5* 4.3 4.0 4.5  CL 102 103 103 104 101  CO2 27 29 29 30 27   GLUCOSE 123* 113* 120* 114* 136*  BUN 26* 33* 28* 26* 30*  CREATININE 0.83 0.95 0.72 0.68 0.80  CALCIUM 8.7 8.4 8.6 8.5 9.0   Liver Function Tests:  Recent Labs Lab 02/05/14 0003  AST 18  ALT 9  ALKPHOS 60  BILITOT 0.3  PROT 6.3  ALBUMIN 2.7*   No results found for this basename: LIPASE, AMYLASE,  in the last 168 hours No results found for this basename: AMMONIA,  in the last 168 hours CBC:  Recent Labs Lab 01/30/14 0743 01/31/14 0446 02/01/14 0442 02/05/14 0003  WBC 7.8 8.2 6.1 8.6  NEUTROABS 6.5  --   --   --   HGB 13.5 13.0 12.2 12.5  HCT 40.8 39.5 37.7 36.9  MCV 96.5 96.3 98.2 98.9  PLT 247 248 205 170   Cardiac Enzymes:  Recent Labs Lab 01/30/14 0743 01/30/14 1144 01/30/14 1700 01/30/14 2303 02/05/14 0003  TROPONINI 2.19* 1.81* 1.66* 1.53* 0.62*    CBG: No  results found for this basename: GLUCAP,  in the last 168 hours   Radiological Exams on Admission: Dg Chest Portable 1 View  02/05/2014   CLINICAL DATA:  Shortness of breath, dementia.  History of CHF.  EXAM: PORTABLE CHEST - 1 VIEW  COMPARISON:  Chest radiograph January 30, 2014  FINDINGS: The cardiac silhouette remains moderately enlarged, mediastinal silhouette is nonsuspicious; calcified aortic knob. Increasing central pulmonary vascular congestion, interstitial prominence with bibasilar airspace opacities, small to moderate bilateral pleural effusions.  No pneumothorax. Patient is osteopenic. Multiple EKG lines overlie the patient and may obscure subtle underlying pathology.  IMPRESSION: Stable cardiomegaly, worsening interstitial and alveolar airspace opacities likely reflect pulmonary edema with increasing small  to moderate bilateral pleural effusions.   Electronically Signed   By: Awilda Metroourtnay  Bloomer   On: 02/05/2014 00:32    EKG: Independently reviewed. Trigeminy rhythm, no acute ST T changes.   Assessment/Plan Present on Admission:  . CHF, acute on chronic . Edema of both legs . Candida infection . Aortic stenosis, moderate . Ischemic cardiomyopathy . DNR (do not resuscitate) . Systolic CHF, acute on chronic  PLAN:  Will admit her for acute on chronic CHF, in the setting of moderate AS, and medical treatment patient with ischemic CMP and recent MI.  She is still volume overload, and will need to be diuresed.  Unfortunately, her BP is soft, and she has moderate AS, making aggressive diurese not possible.  Will admit her to telemetry, hold her HTN meds, and start  1/2 mg of Bumex IV every 12 hours.  She is going to continue ASA, statin, and oxygen supplement.  She is stable at this time, and her code status of DNR will be honored.  Will admit her to Dr Alonza SmokerFagan's service as per prior arrangement.    Other plans as per orders.  Code Status: DNR.  Houston SirenLE,Emmogene Simson, MD. [facility] Pager [phone] 7pm to 7am.  02/05/2014, 2:07 AM

## 2014-02-05 NOTE — Progress Notes (Signed)
NT reported that she was rolling patient from her side to the bedpan and patient started to turn cyanotic and Pulse oximeter started beeping.  When she looked at it O2 was in the 80s. She immediately sat patient up in high Fowlers, O2 returned to the 90s and color returned to patient. NT immediately called the nurse. Upon assessment O2 96, skin color WNL, breathing was regular and non labored. No current signs of acute distress at this time.

## 2014-02-05 NOTE — Consult Note (Signed)
The patient was seen and examined, and I agree with the assessment and plan as documented above. Pt says breathing is better and denies chest pain. I spoke with Mr. Darcey Nora, the patient's son from her 1st marriage, and explained the overall clinical picture and its complexity to him.  RECS: Increase diuretic dosage with careful monitoring of BP given moderate to severe aortic stenosis. Continue ASA, statin, beta blocker, and low-dose Aldactone. At time of discharge, would consider Lasix 40 mg po bid along with Aldactone 12.5 mg daily for diuretics.

## 2014-02-05 NOTE — Care Management Utilization Note (Signed)
UR completed 

## 2014-02-05 NOTE — Clinical Social Work Psychosocial (Signed)
Clinical Social Work Department BRIEF PSYCHOSOCIAL ASSESSMENT 02/05/2014  Patient:  Carla Ward, Carla Ward     Account Number:  [id number]     Admit date:  02/04/2014  Clinical Social Worker:  Wyatt Haste  Date/Time:  02/05/2014 09:22 AM  Referred by:  CSW  Date Referred:  02/05/2014 Referred for  SNF Placement   Other Referral:   Interview type:  Patient Other interview type:   son- Carla Ward    PSYCHOSOCIAL DATA Living Status:  FACILITY Admitted from facility:  [facility] Level of care:  [place] Primary support name:  Carla Ward Primary support relationship to patient:  CHILD, ADULT Degree of support available:   supportive    CURRENT CONCERNS Current Concerns  Post-Acute Placement   Other Concerns:    SOCIAL WORK ASSESSMENT / PLAN CSW met with pt and pt's son Carla Ward at bedside. Pt d/c yesterday to [facility]. Carla Ward reports he received a call last night stating pt took her oxygen off at facility and when staff checked on her she was short of breath. Pt came to ED and was admitted for acute on chronic CHF. Pt alert, but oriented to self only. Carla Ward is involved and supportive. He had completed paperwork to sign pt into [facility] yesterday. Discussed d/c plan from hospital and he requests pt to return to [facility]. CSW spoke with Tami at [facility] who reports okay for return when stable. No FL2 needed per Tami.   Assessment/plan status:  Psychosocial Support/Ongoing Assessment of Needs Other assessment/ plan:   Information/referral to community resources:   [facility]    PATIENT'S/FAMILY'S RESPONSE TO PLAN OF CARE: Pt unable to discuss plan of care at this time. Son reports positive feelings regarding return to [facility] when stable to begin therapy. CSW will continue to follow.       Benay Pike, [place]

## 2014-02-05 NOTE — Progress Notes (Signed)
INITIAL NUTRITION ASSESSMENT  DOCUMENTATION CODES Per approved criteria  -Not Applicable   INTERVENTION:  High protein snack daily  Follow for nutrition care  NUTRITION DIAGNOSIS: Unplanned wt gain related to CHF as evidenced by 6% above UBW.   Goal: Pt to meet >/= 90% of their estimated nutrition needs     Monitor: Po intake, labs, I/O's and wt trends    Reason for Assessment: Malnutrition Screen Score =  2  80 y.o. female  ASSESSMENT:   Pt has hx of multiple inflammatory diseases (COPD, CHF and Dementia).  She has been re-admitted due to acute on chronic CHF (BNP-19,000). Home diet reported as CHO modified / Low Sodium with good appetite per pt. Pt ate 75% of breakfast.  She denies weight loss prior to admission. In fact, she is 6% above her reported usual weight. She is reciveiving IV diuretic. Weight decrease from yesterday likely fluid related and desirable. Nutrition focused physical exam deferred at this time due to pt preference.  Height: Ht Readings from Last 1 Encounters:  02/05/14 5\' 3"  (1.6 m)    Weight: Wt Readings from Last 1 Encounters:  02/05/14 133 lb 14.4 oz (60.737 kg)    Ideal Body Weight: 115# (52 kg)  % Ideal Body Weight: 116%  Wt Readings from Last 10 Encounters:  02/05/14 133 lb 14.4 oz (60.737 kg)  02/04/14 139 lb 1.6 oz (63.095 kg)    Usual Body Weight: 126-130# per pt  % Usual Body Weight: 106%  BMI:  Body mass index is 23.73 kg/(m^2).normal range   Estimated Nutritional Needs: Kcal: 1500-1600  Protein: 73-80 gr  Fluid: per MD goals  Skin: intact  Diet Order: Carb Control  EDUCATION NEEDS: -No education needs identified at this time   Intake/Output Summary (Last 24 hours) at 02/05/14 1342 Last data filed at 02/05/14 0800  Gross per 24 hour  Intake    240 ml  Output    100 ml  Net    140 ml    Last BM:  02/04/14  Labs:   Recent Labs Lab 02/02/14 0541 02/04/14 0553 02/05/14 0003  NA 141 142 139  K 4.3 4.0  4.5  CL 103 104 101  CO2 29 30 27   BUN 28* 26* 30*  CREATININE 0.72 0.68 0.80  CALCIUM 8.6 8.5 9.0  GLUCOSE 120* 114* 136*    CBG (last 3)   Recent Labs  02/05/14 0730 02/05/14 1141  GLUCAP 124* 136*    Scheduled Meds: . aspirin EC  81 mg Oral Daily  . atorvastatin  40 mg Oral q1800  . doxycycline  100 mg Oral Q12H  . enoxaparin (LOVENOX) injection  30 mg Subcutaneous Q24H  . fluconazole  100 mg Oral Daily  . furosemide  40 mg Intravenous BID  . insulin aspart  0-9 Units Subcutaneous 6 times per day  . metoprolol tartrate  25 mg Oral BID  . nitroGLYCERIN      . potassium chloride SA  20 mEq Oral Daily  . sodium chloride  3 mL Intravenous Q12H  . spironolactone  12.5 mg Oral Daily    Continuous Infusions:   Past Medical History  Diagnosis Date  . Hypertension   . Heart failure   . Aortic valve disorder   . Diabetes mellitus without complication   . Dementia   . Edema   . CHF (congestive heart failure)   . Myocardial infarction   . COPD (chronic obstructive pulmonary disease)   . Shortness of breath   .  Pneumonia     Past Surgical History  Procedure Laterality Date  . Abdominal surgery      Royann Shivers MS,RD,CSG,LDN Office: #[phone] Pager: [phone]

## 2014-02-05 NOTE — Progress Notes (Signed)
Patient has had 2 more episodes of O2 dropping to the 80s while being turned to be put on the bed pan. MD notified. No new orders at this time. Will continue to monitor.

## 2014-02-05 NOTE — Progress Notes (Signed)
Subjective: Carla Ward was readmitted last night. She has been treated with IV diuretics again. Chest x-ray reveals pulmonary edema. BNP is 19,000. She denies being any more short of breath than usual last night. She denies having had any angina.  Objective: Vital signs in last 24 hours: Filed Vitals:   02/05/14 0438 02/05/14 0800 02/05/14 1000 02/05/14 1209  BP: 108/74   90/34  Pulse: 104   88  Temp: 97.6 F (36.4 C)     TempSrc: Oral     Resp: 18     Height: 5\' 3"  (1.6 m)     Weight: 133 lb 14.4 oz (60.737 kg)     SpO2: 91% 98% 99% 95%   Weight change:   Intake/Output Summary (Last 24 hours) at 02/05/14 1312 Last data filed at 02/05/14 0800  Gross per 24 hour  Intake    240 ml  Output    100 ml  Net    140 ml    Physical Exam: Alert and in no distress. Lungs reveal basilar rales. Heart irregular with a grade 2 systolic murmur. Telemetry reveals normal sinus rhythm with frequent PVCs. Abdomen soft and nontender. Extremities reveal trace edema in the lower legs with chronic venous insufficiency changes. Skin of the breast reveals continued redness and flaking of skin.  Lab Results:    Results for orders placed during the hospital encounter of 02/04/14 (from the past 24 hour(s))  CBC     Status: Abnormal   Collection Time    02/05/14 12:03 AM      Result Value Ref Range   WBC 8.6  4.0 - 10.5 K/uL   RBC 3.73 (*) 3.87 - 5.11 MIL/uL   Hemoglobin 12.5  12.0 - 15.0 g/dL   HCT 16.1  09.6 - 04.5 %   MCV 98.9  78.0 - 100.0 fL   MCH 33.5  26.0 - 34.0 pg   MCHC 33.9  30.0 - 36.0 g/dL   RDW 40.9  81.1 - 91.4 %   Platelets 170  150 - 400 K/uL  PRO B NATRIURETIC PEPTIDE     Status: Abnormal   Collection Time    02/05/14 12:03 AM      Result Value Ref Range   Pro B Natriuretic peptide (BNP) 19583.0 (*) 0 - 450 pg/mL  PROTIME-INR     Status: None   Collection Time    02/05/14 12:03 AM      Result Value Ref Range   Prothrombin Time 14.5  11.6 - 15.2 seconds   INR 1.13  0.00 -  1.49  TROPONIN I     Status: Abnormal   Collection Time    02/05/14 12:03 AM      Result Value Ref Range   Troponin I 0.62 (*) <0.30 ng/mL  COMPREHENSIVE METABOLIC PANEL     Status: Abnormal   Collection Time    02/05/14 12:03 AM      Result Value Ref Range   Sodium 139  137 - 147 mEq/L   Potassium 4.5  3.7 - 5.3 mEq/L   Chloride 101  96 - 112 mEq/L   CO2 27  19 - 32 mEq/L   Glucose, Bld 136 (*) 70 - 99 mg/dL   BUN 30 (*) 6 - 23 mg/dL   Creatinine, Ser 7.82  0.50 - 1.10 mg/dL   Calcium 9.0  8.4 - 95.6 mg/dL   Total Protein 6.3  6.0 - 8.3 g/dL   Albumin 2.7 (*) 3.5 - 5.2 g/dL  AST 18  0 - 37 U/L   ALT 9  0 - 35 U/L   Alkaline Phosphatase 60  39 - 117 U/L   Total Bilirubin 0.3  0.3 - 1.2 mg/dL   GFR calc non Af Amer 60 (*) >90 mL/min   GFR calc Af Amer 69 (*) >90 mL/min   Anion gap 11  5 - 15  GLUCOSE, CAPILLARY     Status: Abnormal   Collection Time    02/05/14  7:30 AM      Result Value Ref Range   Glucose-Capillary 124 (*) 70 - 99 mg/dL   Comment 1 Notify RN    GLUCOSE, CAPILLARY     Status: Abnormal   Collection Time    02/05/14 11:41 AM      Result Value Ref Range   Glucose-Capillary 136 (*) 70 - 99 mg/dL   Comment 1 Notify RN       ABGS No results found for this basename: PHART, PCO2, PO2ART, TCO2, HCO3,  in the last 72 hours CULTURES Recent Results (from the past 240 hour(s))  MRSA PCR SCREENING     Status: None   Collection Time    01/30/14 11:20 AM      Result Value Ref Range Status   MRSA by PCR NEGATIVE  NEGATIVE Final   Comment:            The GeneXpert MRSA Assay (FDA     approved for NASAL specimens     only), is one component of a     comprehensive MRSA colonization     surveillance program. It is not     intended to diagnose MRSA     infection nor to guide or     monitor treatment for     MRSA infections.   Studies/Results: Dg Chest Portable 1 View  02/05/2014   CLINICAL DATA:  Shortness of breath, dementia.  History of CHF.  EXAM:  PORTABLE CHEST - 1 VIEW  COMPARISON:  Chest radiograph January 30, 2014  FINDINGS: The cardiac silhouette remains moderately enlarged, mediastinal silhouette is nonsuspicious; calcified aortic knob. Increasing central pulmonary vascular congestion, interstitial prominence with bibasilar airspace opacities, small to moderate bilateral pleural effusions.  No pneumothorax. Patient is osteopenic. Multiple EKG lines overlie the patient and may obscure subtle underlying pathology.  IMPRESSION: Stable cardiomegaly, worsening interstitial and alveolar airspace opacities likely reflect pulmonary edema with increasing small to moderate bilateral pleural effusions.   Electronically Signed   By: Awilda Metroourtnay  Bloomer   On: 02/05/2014 00:32   Micro Results: Recent Results (from the past 240 hour(s))  MRSA PCR SCREENING     Status: None   Collection Time    01/30/14 11:20 AM      Result Value Ref Range Status   MRSA by PCR NEGATIVE  NEGATIVE Final   Comment:            The GeneXpert MRSA Assay (FDA     approved for NASAL specimens     only), is one component of a     comprehensive MRSA colonization     surveillance program. It is not     intended to diagnose MRSA     infection nor to guide or     monitor treatment for     MRSA infections.   Studies/Results: Dg Chest Portable 1 View  02/05/2014   CLINICAL DATA:  Shortness of breath, dementia.  History of CHF.  EXAM: PORTABLE CHEST - 1 VIEW  COMPARISON:  Chest radiograph January 30, 2014  FINDINGS: The cardiac silhouette remains moderately enlarged, mediastinal silhouette is nonsuspicious; calcified aortic knob. Increasing central pulmonary vascular congestion, interstitial prominence with bibasilar airspace opacities, small to moderate bilateral pleural effusions.  No pneumothorax. Patient is osteopenic. Multiple EKG lines overlie the patient and may obscure subtle underlying pathology.  IMPRESSION: Stable cardiomegaly, worsening interstitial and alveolar airspace  opacities likely reflect pulmonary edema with increasing small to moderate bilateral pleural effusions.   Electronically Signed   By: Awilda Metro   On: 02/05/2014 00:32   Medications:  I have reviewed the patient's current medications Scheduled Meds: . aspirin EC  81 mg Oral Daily  . atorvastatin  40 mg Oral q1800  . doxycycline  100 mg Oral Q12H  . enoxaparin (LOVENOX) injection  30 mg Subcutaneous Q24H  . fluconazole  100 mg Oral Daily  . furosemide  40 mg Intravenous BID  . insulin aspart  0-9 Units Subcutaneous 6 times per day  . metoprolol tartrate  25 mg Oral BID  . nitroGLYCERIN      . potassium chloride SA  20 mEq Oral Daily  . sodium chloride  3 mL Intravenous Q12H  . spironolactone  12.5 mg Oral Daily   Continuous Infusions:  PRN Meds:.acetaminophen, ondansetron (ZOFRAN) IV, ondansetron   Assessment/Plan: #1. Acute systolic CHF. Treat with IV Lasix. Continue metoprolol. Losartan has been held because of low normal blood pressures. Continue spironolactone. Renal function is stable. Appreciate cardiology consult. #2. Aortic stenosis. #3. Recent anteroseptal MI. Asymptomatic from a pain standpoint. #4. Diabetes. Stable. #5. Probable fungal skin infection. She had been on Nizoral topically and doxycycline orally per dermatology. She was started on Diflucan orally on admission which will be continued for now. #6. Dementia. Stable. Principal Problem:   CHF, acute on chronic Active Problems:   Edema of both legs   Candida infection   Aortic stenosis, moderate   Ischemic cardiomyopathy   DNR (do not resuscitate)   Dementia   DM (diabetes mellitus)   Systolic CHF, acute on chronic     LOS: 1 day   Tamana Hatfield 02/05/2014, 1:12 PM

## 2014-02-05 NOTE — Consult Note (Signed)
CARDIOLOGY CONSULT NOTE   Patient ID: Carla Ward MRN: [id number] DOB/AGE: 1914/08/27 80 y.o.  Admit Date: 02/04/2014 Referring Physician: Carylon Perches MD Primary Physician: Carylon Perches, MD Consulting Cardiologist: Purvis Sheffield, MD Primary Cardiologist: Purvis Sheffield MD Reason for Consultation: Recurrent CHF in the setting of ICM and Systolic Dysfunction   Clinical Summary Ms. Carla Ward is a 80 y.o.female readmitted after being home for 1 day with recurrent exacerbation of CHF after recent admission of antero/lateral STEMI, AS,treated medically due to advanced age and co-morbidities., EF of 20-25%, worsening LEE and dyspnea. She was placed on po lasix 40 mg daily and Spironolactone 12.5 mg  prior to discharge to [facility].   On arrival to ER BP 11/64; 02 Sat 98%, HR 79. Pro-BNP 19,583. Creatinine 0.80, BUN 30 Troponin 0.62 (trending down from recent hospitalization). Albumin 2.7. CXR demonstrated worsening interstitial and alveolar airspace disease, opacities likely pulmonary edema and small bilateral pleural effusions. She was treated with IV lasix 40 mg and admitted for further management. Minimal urine output is recorded since admission.   She is mildy confused but comfortable. Denies chest pain, dyspnea. Complains of itching from rash on her breasts and abdomen. Being treated with Diflucan prior to admission.   No Known Allergies  Medications Scheduled Medications: . aspirin EC  81 mg Oral Daily  . atorvastatin  40 mg Oral q1800  . bumetanide  0.5 mg Intravenous BID  . doxycycline  100 mg Oral Q12H  . enoxaparin (LOVENOX) injection  30 mg Subcutaneous Q24H  . fluconazole  100 mg Oral Daily  . furosemide  40 mg Intramuscular BID  . insulin aspart  0-9 Units Subcutaneous 6 times per day  . metoprolol tartrate  25 mg Oral BID  . potassium chloride SA  20 mEq Oral Daily  . potassium chloride  20 mEq Oral BID  . sodium chloride  3 mL Intravenous Q12H  . spironolactone  12.5 mg  Oral Daily      PRN Medications: ondansetron (ZOFRAN) IV, ondansetron   Past Medical History  Diagnosis Date  . Hypertension   . Heart failure   . Aortic valve disorder   . Diabetes mellitus without complication   . Dementia   . Edema   . CHF (congestive heart failure)   . Myocardial infarction   . COPD (chronic obstructive pulmonary disease)   . Shortness of breath   . Pneumonia     Past Surgical History  Procedure Laterality Date  . Abdominal surgery      No family history on file.  Social History Ms. Carla Ward reports that she has never smoked. She does not have any smokeless tobacco history on file. Ms. Carla Ward reports that she does not drink alcohol.  Review of Systems Otherwise reviewed and negative except as outlined.  Physical Examination Blood pressure 108/74, pulse 104, temperature 97.6 F (36.4 C), temperature source Oral, resp. rate 18, height 5\' 3"  (1.6 m), weight 133 lb 14.4 oz (60.737 kg), SpO2 91.00%.  Intake/Output Summary (Last 24 hours) at 02/05/14 1034 Last data filed at 02/05/14 0157  Gross per 24 hour  Intake      0 ml  Output    100 ml  Net   -100 ml    Telemetry:SR with PVC's, GEN: HEENT: Conjunctiva and lids normal, oropharynx clear with moist mucosa. Neck: Supple, no elevated JVP or carotid bruits, no thyromegaly. Lungs: Clear to auscultation, nonlabored breathing at rest. Cardiac: Regular rate and rhythm, no S3 or significant systolic murmur,  no pericardial rub. Abdomen: Soft, nontender, no hepatomegaly, bowel sounds present, no guarding or rebound. Extremities: No pitting edema, distal pulses 2+. Skin: Warm and dry. Musculoskeletal: No kyphosis. Neuropsychiatric: Alert and oriented x3, affect grossly appropriate.  Prior Cardiac Testing/Procedures 1.Echocardiogram 01/31/2014 Left ventricle: The cavity size was normal. There was moderate concentric hypertrophy. Systolic function was severely reduced. The estimated ejection fraction  was in the range of 20% to 25%. There is akinesis of the entireanteroseptal myocardium. Doppler parameters are consistent with abnormal left ventricular relaxation (grade 1 diastolic dysfunction). Doppler parameters are consistent with high ventricular filling pressure. - Aortic valve: Cusp separation was reduced. There was moderate to severe stenosis. There was trivial regurgitation. - Left atrium: The atrium was moderately dilated. - Pericardium, extracardiac: There was a moderate-sized left pleural effusion.   Lab Results  Basic Metabolic Panel:  Recent Labs Lab 01/31/14 0446 02/01/14 0442 02/02/14 0541 02/04/14 0553 02/05/14 0003  NA 142 143 141 142 139  K 4.0 3.5* 4.3 4.0 4.5  CL 102 103 103 104 101  CO2 27 29 29 30 27   GLUCOSE 123* 113* 120* 114* 136*  BUN 26* 33* 28* 26* 30*  CREATININE 0.83 0.95 0.72 0.68 0.80  CALCIUM 8.7 8.4 8.6 8.5 9.0    Liver Function Tests:  Recent Labs Lab 02/05/14 0003  AST 18  ALT 9  ALKPHOS 60  BILITOT 0.3  PROT 6.3  ALBUMIN 2.7*    CBC:  Recent Labs Lab 01/30/14 0743 01/31/14 0446 02/01/14 0442 02/05/14 0003  WBC 7.8 8.2 6.1 8.6  NEUTROABS 6.5  --   --   --   HGB 13.5 13.0 12.2 12.5  HCT 40.8 39.5 37.7 36.9  MCV 96.5 96.3 98.2 98.9  PLT 247 248 205 170    Cardiac Enzymes:  Recent Labs Lab 01/30/14 0743 01/30/14 1144 01/30/14 1700 01/30/14 2303 02/05/14 0003  TROPONINI 2.19* 1.81* 1.66* 1.53* 0.62*    BNP: 1,6109  Radiology: Dg Chest Portable 1 View  02/05/2014   CLINICAL DATA:  Shortness of breath, dementia.  History of CHF.  EXAM: PORTABLE CHEST - 1 VIEW  COMPARISON:  Chest radiograph January 30, 2014  FINDINGS: The cardiac silhouette remains moderately enlarged, mediastinal silhouette is nonsuspicious; calcified aortic knob. Increasing central pulmonary vascular congestion, interstitial prominence with bibasilar airspace opacities, small to moderate bilateral pleural effusions.  No pneumothorax. Patient  is osteopenic. Multiple EKG lines overlie the patient and may obscure subtle underlying pathology.  IMPRESSION: Stable cardiomegaly, worsening interstitial and alveolar airspace opacities likely reflect pulmonary edema with increasing small to moderate bilateral pleural effusions.   Electronically Signed   By: Awilda Metro   On: 02/05/2014 00:32     ECG: Sinus Rhythm with trigeminy and PVC's, anterior Q-waves.    Impression and Recommendations  1.Acute on Chronic Systolic CHF in the setting of severe systolic dysfunction: Admitted with pulmonary edema and fluid overload.Minimal diureses from one dose of IV lasix and Bumex 0.5 mg po. With significant reduction in EF will need more aggressive diureses. Will place her on Lasix 40 mg IV BID, and possible titrate to Q 8 hours should diureses not improve. Will add back spironolactone not started on admission and keep potasium replacement with careful monitoring of BMET with use of spironolactone for potassium monitoring.   She has severe Systolic dysfunction and does not appear be tolerating lower dose of lasix as OP. Will need to give higher dose on discharge. Continue BB. ARB is not on board yet due  to renal insufficiency. Was on cozaar 100 mg daily. Will consider adding back at lower dose. Will discuss with Dr. Purvis SheffieldKoneswaran. BP low normal due to decreased CO.   2. Recent STEMI: Treated with BB, statin ASA. No planned studies due to age and co-morbidities. This has been discussed at length with her son by Dr. Purvis SheffieldKoneswaran. Please see his note,   3. Mild dementia; Per Dr. Ouida SillsFagan.  4. DNR  Signed: Bettey MareKathryn M. Clatie Kessen NP  02/05/2014, 10:34 AM Co-Sign MD

## 2014-02-06 LAB — BASIC METABOLIC PANEL
ANION GAP: 12 (ref 5–15)
BUN: 30 mg/dL — AB (ref 6–23)
CHLORIDE: 98 meq/L (ref 96–112)
CO2: 31 mEq/L (ref 19–32)
CREATININE: 0.83 mg/dL (ref 0.50–1.10)
Calcium: 9.3 mg/dL (ref 8.4–10.5)
GFR calc Af Amer: 66 mL/min — ABNORMAL LOW (ref >90)
GFR calc non Af Amer: 57 mL/min — ABNORMAL LOW (ref >90)
Glucose, Bld: 109 mg/dL — ABNORMAL HIGH (ref 70–99)
POTASSIUM: 4.1 meq/L (ref 3.7–5.3)
Sodium: 141 mEq/L (ref 137–147)

## 2014-02-06 LAB — GLUCOSE, CAPILLARY
GLUCOSE-CAPILLARY: 106 mg/dL — AB (ref 70–99)
GLUCOSE-CAPILLARY: 127 mg/dL — AB (ref 70–99)
Glucose-Capillary: 136 mg/dL — ABNORMAL HIGH (ref 70–99)
Glucose-Capillary: 138 mg/dL — ABNORMAL HIGH (ref 70–99)

## 2014-02-06 MED ORDER — LORAZEPAM 0.5 MG PO TABS
0.5000 mg | ORAL_TABLET | Freq: Once | ORAL | Status: AC
  Administered 2014-02-06: 0.5 mg via ORAL
  Filled 2014-02-06: qty 1

## 2014-02-06 MED ORDER — LOSARTAN POTASSIUM 50 MG PO TABS
50.0000 mg | ORAL_TABLET | Freq: Every day | ORAL | Status: DC
  Administered 2014-02-06 – 2014-02-09 (×3): 50 mg via ORAL
  Filled 2014-02-06 (×3): qty 1

## 2014-02-06 NOTE — Progress Notes (Signed)
Subjective: She is alert and breathing comfortably this morning. She has been confused overnight. She has felt [facility] has been at [employer]. She denies feeling short of breath. She denies any chest pain.  Objective: Vital signs in last 24 hours: Filed Vitals:   02/05/14 1000 02/05/14 1209 02/05/14 1300 02/06/14 0538  BP:  90/34 102/72 121/51  Pulse:  88 85 103  Temp:   97.4 F (36.3 C) 97.9 F (36.6 C)  TempSrc:   Oral Oral  Resp:   18 20  Height:      Weight:      SpO2: 99% 95% 96% 96%   Weight change:   Intake/Output Summary (Last 24 hours) at 02/06/14 0741 Last data filed at 02/06/14 0653  Gross per 24 hour  Intake    960 ml  Output   1850 ml  Net   -890 ml    Physical Exam: No distress. Lungs reveal basilar rales. Heart irregular with a rate in the 90s on telemetry with PVCs. Abdomen is soft and nontender. Extremities reveal chronic changes in the legs. Skin on the chest and breast unchanged.  Lab Results:    Results for orders placed during the hospital encounter of 02/04/14 (from the past 24 hour(s))  GLUCOSE, CAPILLARY     Status: Abnormal   Collection Time    02/05/14 11:41 AM      Result Value Ref Range   Glucose-Capillary 136 (*) 70 - 99 mg/dL   Comment 1 Notify RN    GLUCOSE, CAPILLARY     Status: Abnormal   Collection Time    02/05/14  4:14 PM      Result Value Ref Range   Glucose-Capillary 143 (*) 70 - 99 mg/dL  GLUCOSE, CAPILLARY     Status: Abnormal   Collection Time    02/05/14  8:31 PM      Result Value Ref Range   Glucose-Capillary 106 (*) 70 - 99 mg/dL  BASIC METABOLIC PANEL     Status: Abnormal   Collection Time    02/06/14  6:03 AM      Result Value Ref Range   Sodium 141  137 - 147 mEq/L   Potassium 4.1  3.7 - 5.3 mEq/L   Chloride 98  96 - 112 mEq/L   CO2 31  19 - 32 mEq/L   Glucose, Bld 109 (*) 70 - 99 mg/dL   BUN 30 (*) 6 - 23 mg/dL   Creatinine, Ser 1.610.83  0.50 - 1.10 mg/dL   Calcium 9.3  8.4 - 09.610.5 mg/dL   GFR calc non Af Amer 57 (*)  >90 mL/min   GFR calc Af Amer 66 (*) >90 mL/min   Anion gap 12  5 - 15  GLUCOSE, CAPILLARY     Status: Abnormal   Collection Time    02/06/14  7:36 AM      Result Value Ref Range   Glucose-Capillary 106 (*) 70 - 99 mg/dL   Comment 1 Notify RN       ABGS No results found for this basename: PHART, PCO2, PO2ART, TCO2, HCO3,  in the last 72 hours CULTURES Recent Results (from the past 240 hour(s))  MRSA PCR SCREENING     Status: None   Collection Time    01/30/14 11:20 AM      Result Value Ref Range Status   MRSA by PCR NEGATIVE  NEGATIVE Final   Comment:            The  GeneXpert MRSA Assay (FDA     approved for NASAL specimens     only), is one component of a     comprehensive MRSA colonization     surveillance program. It is not     intended to diagnose MRSA     infection nor to guide or     monitor treatment for     MRSA infections.   Studies/Results: Dg Chest Portable 1 View  02/05/2014   CLINICAL DATA:  Shortness of breath, dementia.  History of CHF.  EXAM: PORTABLE CHEST - 1 VIEW  COMPARISON:  Chest radiograph January 30, 2014  FINDINGS: The cardiac silhouette remains moderately enlarged, mediastinal silhouette is nonsuspicious; calcified aortic knob. Increasing central pulmonary vascular congestion, interstitial prominence with bibasilar airspace opacities, small to moderate bilateral pleural effusions.  No pneumothorax. Patient is osteopenic. Multiple EKG lines overlie the patient and may obscure subtle underlying pathology.  IMPRESSION: Stable cardiomegaly, worsening interstitial and alveolar airspace opacities likely reflect pulmonary edema with increasing small to moderate bilateral pleural effusions.   Electronically Signed   By: Awilda Metro   On: 02/05/2014 00:32   Micro Results: Recent Results (from the past 240 hour(s))  MRSA PCR SCREENING     Status: None   Collection Time    01/30/14 11:20 AM      Result Value Ref Range Status   MRSA by PCR NEGATIVE  NEGATIVE  Final   Comment:            The GeneXpert MRSA Assay (FDA     approved for NASAL specimens     only), is one component of a     comprehensive MRSA colonization     surveillance program. It is not     intended to diagnose MRSA     infection nor to guide or     monitor treatment for     MRSA infections.   Studies/Results: Dg Chest Portable 1 View  02/05/2014   CLINICAL DATA:  Shortness of breath, dementia.  History of CHF.  EXAM: PORTABLE CHEST - 1 VIEW  COMPARISON:  Chest radiograph January 30, 2014  FINDINGS: The cardiac silhouette remains moderately enlarged, mediastinal silhouette is nonsuspicious; calcified aortic knob. Increasing central pulmonary vascular congestion, interstitial prominence with bibasilar airspace opacities, small to moderate bilateral pleural effusions.  No pneumothorax. Patient is osteopenic. Multiple EKG lines overlie the patient and may obscure subtle underlying pathology.  IMPRESSION: Stable cardiomegaly, worsening interstitial and alveolar airspace opacities likely reflect pulmonary edema with increasing small to moderate bilateral pleural effusions.   Electronically Signed   By: Awilda Metro   On: 02/05/2014 00:32   Medications:  I have reviewed the patient's current medications Scheduled Meds: . aspirin EC  81 mg Oral Daily  . atorvastatin  40 mg Oral q1800  . doxycycline  100 mg Oral Q12H  . enoxaparin (LOVENOX) injection  30 mg Subcutaneous Q24H  . fluconazole  100 mg Oral Daily  . furosemide  40 mg Intravenous BID  . insulin aspart  0-5 Units Subcutaneous QHS  . insulin aspart  0-9 Units Subcutaneous TID WC  . losartan  50 mg Oral Daily  . metoprolol tartrate  25 mg Oral BID  . potassium chloride SA  20 mEq Oral Daily  . sodium chloride  3 mL Intravenous Q12H  . spironolactone  12.5 mg Oral Daily   Continuous Infusions:  PRN Meds:.acetaminophen, ondansetron (ZOFRAN) IV, ondansetron   Assessment/Plan: #1. Acute systolic heart failure. Continue IV  Lasix. BUN and creatinine are stable. Potassium is 4.1. Systolic blood pressures range from 90-121. Restart losartan at 50 mg daily. Continue metoprolol and Aldactone. She is oxygenating well on supplemental oxygen by nasal cannula.  She had a negative fluid balance of 890 yesterday. #2. Recent anteroseptal MI. #3. Diabetes. Fasting glucose is 109. #4. Dementia. Principal Problem:   CHF, acute on chronic Active Problems:   Edema of both legs   Candida infection   Aortic stenosis, moderate   Ischemic cardiomyopathy   DNR (do not resuscitate)   Dementia   DM (diabetes mellitus)   Systolic CHF, acute on chronic     LOS: 2 days   Min Tunnell 02/06/2014, 7:41 AM

## 2014-02-07 LAB — GLUCOSE, CAPILLARY
GLUCOSE-CAPILLARY: 141 mg/dL — AB (ref 70–99)
GLUCOSE-CAPILLARY: 110 mg/dL — AB (ref 70–99)
GLUCOSE-CAPILLARY: 142 mg/dL — AB (ref 70–99)
Glucose-Capillary: 129 mg/dL — ABNORMAL HIGH (ref 70–99)

## 2014-02-07 LAB — BASIC METABOLIC PANEL
Anion gap: 12 (ref 5–15)
BUN: 32 mg/dL — ABNORMAL HIGH (ref 6–23)
CALCIUM: 9 mg/dL (ref 8.4–10.5)
CO2: 32 mEq/L (ref 19–32)
CREATININE: 0.85 mg/dL (ref 0.50–1.10)
Chloride: 100 mEq/L (ref 96–112)
GFR calc Af Amer: 64 mL/min — ABNORMAL LOW (ref >90)
GFR, EST NON AFRICAN AMERICAN: 56 mL/min — AB (ref >90)
GLUCOSE: 120 mg/dL — AB (ref 70–99)
Potassium: 3.9 mEq/L (ref 3.7–5.3)
Sodium: 144 mEq/L (ref 137–147)

## 2014-02-07 MED ORDER — ENOXAPARIN SODIUM 40 MG/0.4ML ~~LOC~~ SOLN
40.0000 mg | SUBCUTANEOUS | Status: DC
  Administered 2014-02-08: 40 mg via SUBCUTANEOUS
  Filled 2014-02-07 (×2): qty 0.4

## 2014-02-07 MED ORDER — METOPROLOL SUCCINATE ER 50 MG PO TB24
50.0000 mg | ORAL_TABLET | Freq: Every day | ORAL | Status: DC
  Administered 2014-02-08 – 2014-02-09 (×2): 50 mg via ORAL
  Filled 2014-02-07 (×3): qty 1

## 2014-02-07 MED ORDER — POTASSIUM CHLORIDE CRYS ER 20 MEQ PO TBCR
20.0000 meq | EXTENDED_RELEASE_TABLET | Freq: Two times a day (BID) | ORAL | Status: DC
  Administered 2014-02-07 – 2014-02-09 (×4): 20 meq via ORAL
  Filled 2014-02-07 (×4): qty 1

## 2014-02-07 NOTE — Progress Notes (Signed)
Subjective: She denies shortness of breath this morning. She is sedated and is being assisted and observed by nursing assistant. She reportedly ate well for breakfast. She is oxygenating well at 98% on 4 L by nasal cannula with a heart rate in the 70s. Telemetry reveals sinus rhythm with PVCs.  Fluid balance was -1495. Objective: Vital signs in last 24 hours: Filed Vitals:   02/06/14 1300 02/06/14 1500 02/06/14 2231 02/07/14 0506  BP: 110/62 160/117 117/58 116/66  Pulse: 92 72 68 89  Temp: 97.5 F (36.4 C) 97.9 F (36.6 C) 97.4 F (36.3 C) 97.7 F (36.5 C)  TempSrc: Oral Oral Oral Oral  Resp: 18 18 20 18   Height:      Weight:      SpO2: 96% 99% 95% 100%   Weight change:   Intake/Output Summary (Last 24 hours) at 02/07/14 1032 Last data filed at 02/07/14 1030  Gross per 24 hour  Intake    480 ml  Output   1975 ml  Net  -1495 ml    Physical Exam: Drowsy but awakens and speaks sensibly. No distress. Lungs reveal minimal rales. Heart regular with a grade 3 systolic murmur with frequent ectopy. Abdomen soft and nontender. Extremities reveal no edema. Breasts skin is slowly improving.  Lab Results:    Results for orders placed during the hospital encounter of 02/04/14 (from the past 24 hour(s))  GLUCOSE, CAPILLARY     Status: Abnormal   Collection Time    02/06/14 11:07 AM      Result Value Ref Range   Glucose-Capillary 136 (*) 70 - 99 mg/dL   Comment 1 Notify RN    GLUCOSE, CAPILLARY     Status: Abnormal   Collection Time    02/06/14  4:27 PM      Result Value Ref Range   Glucose-Capillary 127 (*) 70 - 99 mg/dL   Comment 1 Notify RN    GLUCOSE, CAPILLARY     Status: Abnormal   Collection Time    02/06/14  8:27 PM      Result Value Ref Range   Glucose-Capillary 138 (*) 70 - 99 mg/dL  BASIC METABOLIC PANEL     Status: Abnormal   Collection Time    02/07/14  6:06 AM      Result Value Ref Range   Sodium 144  137 - 147 mEq/L   Potassium 3.9  3.7 - 5.3 mEq/L   Chloride  100  96 - 112 mEq/L   CO2 32  19 - 32 mEq/L   Glucose, Bld 120 (*) 70 - 99 mg/dL   BUN 32 (*) 6 - 23 mg/dL   Creatinine, Ser 2.33  0.50 - 1.10 mg/dL   Calcium 9.0  8.4 - 00.7 mg/dL   GFR calc non Af Amer 56 (*) >90 mL/min   GFR calc Af Amer 64 (*) >90 mL/min   Anion gap 12  5 - 15  GLUCOSE, CAPILLARY     Status: Abnormal   Collection Time    02/07/14  7:59 AM      Result Value Ref Range   Glucose-Capillary 141 (*) 70 - 99 mg/dL   Comment 1 Notify RN       ABGS No results found for this basename: PHART, PCO2, PO2ART, TCO2, HCO3,  in the last 72 hours CULTURES Recent Results (from the past 240 hour(s))  MRSA PCR SCREENING     Status: None   Collection Time    01/30/14 11:20 AM  Result Value Ref Range Status   MRSA by PCR NEGATIVE  NEGATIVE Final   Comment:            The GeneXpert MRSA Assay (FDA     approved for NASAL specimens     only), is one component of a     comprehensive MRSA colonization     surveillance program. It is not     intended to diagnose MRSA     infection nor to guide or     monitor treatment for     MRSA infections.   Studies/Results: No results found. Micro Results: Recent Results (from the past 240 hour(s))  MRSA PCR SCREENING     Status: None   Collection Time    01/30/14 11:20 AM      Result Value Ref Range Status   MRSA by PCR NEGATIVE  NEGATIVE Final   Comment:            The GeneXpert MRSA Assay (FDA     approved for NASAL specimens     only), is one component of a     comprehensive MRSA colonization     surveillance program. It is not     intended to diagnose MRSA     infection nor to guide or     monitor treatment for     MRSA infections.   Studies/Results: No results found. Medications:  I have reviewed the patient's current medications Scheduled Meds: . aspirin EC  81 mg Oral Daily  . atorvastatin  40 mg Oral q1800  . doxycycline  100 mg Oral Q12H  . enoxaparin (LOVENOX) injection  30 mg Subcutaneous Q24H  .  fluconazole  100 mg Oral Daily  . furosemide  40 mg Intravenous BID  . insulin aspart  0-5 Units Subcutaneous QHS  . insulin aspart  0-9 Units Subcutaneous TID WC  . losartan  50 mg Oral Daily  . metoprolol tartrate  25 mg Oral BID  . potassium chloride SA  20 mEq Oral Daily  . sodium chloride  3 mL Intravenous Q12H  . spironolactone  12.5 mg Oral Daily   Continuous Infusions:  PRN Meds:.acetaminophen, ondansetron (ZOFRAN) IV, ondansetron   Assessment/Plan: #1. Acute systolic heart failure status post anteroseptal MI. Continue IV Lasix. Creatinine is stable at 0.85. Potassium 3.9. Continue supple oxygen. Decreased to 2 L. Modify metoprolol back to Toprol-XL 50 mg daily. #2. Diabetes. Stable with the morning glucose 120. #3. Dementia. Would hope to avoid further benzodiazepines if possible. Principal Problem:   CHF, acute on chronic Active Problems:   Edema of both legs   Candida infection   Aortic stenosis, moderate   Ischemic cardiomyopathy   DNR (do not resuscitate)   Dementia   DM (diabetes mellitus)   Systolic CHF, acute on chronic     LOS: 3 days   Carla Ward 02/07/2014, 10:32 AM

## 2014-02-08 DIAGNOSIS — I5023 Acute on chronic systolic (congestive) heart failure: Secondary | ICD-10-CM

## 2014-02-08 LAB — BASIC METABOLIC PANEL
Anion gap: 8 (ref 5–15)
BUN: 31 mg/dL — ABNORMAL HIGH (ref 6–23)
CALCIUM: 8.6 mg/dL (ref 8.4–10.5)
CHLORIDE: 102 meq/L (ref 96–112)
CO2: 33 mEq/L — ABNORMAL HIGH (ref 19–32)
CREATININE: 0.76 mg/dL (ref 0.50–1.10)
GFR calc Af Amer: 79 mL/min — ABNORMAL LOW (ref >90)
GFR calc non Af Amer: 68 mL/min — ABNORMAL LOW (ref >90)
GLUCOSE: 110 mg/dL — AB (ref 70–99)
Potassium: 4.5 mEq/L (ref 3.7–5.3)
SODIUM: 143 meq/L (ref 137–147)

## 2014-02-08 LAB — GLUCOSE, CAPILLARY
GLUCOSE-CAPILLARY: 150 mg/dL — AB (ref 70–99)
Glucose-Capillary: 105 mg/dL — ABNORMAL HIGH (ref 70–99)
Glucose-Capillary: 115 mg/dL — ABNORMAL HIGH (ref 70–99)
Glucose-Capillary: 145 mg/dL — ABNORMAL HIGH (ref 70–99)

## 2014-02-08 MED ORDER — BIOTENE DRY MOUTH MT LIQD
15.0000 mL | Freq: Two times a day (BID) | OROMUCOSAL | Status: DC
  Administered 2014-02-08: 15 mL via OROMUCOSAL

## 2014-02-08 MED ORDER — SODIUM CHLORIDE 0.9 % IV BOLUS (SEPSIS)
200.0000 mL | Freq: Once | INTRAVENOUS | Status: AC
  Administered 2014-02-08: 200 mL via INTRAVENOUS

## 2014-02-08 NOTE — Progress Notes (Signed)
Subjective: Carla Ward is awake and alert with no signs of lethargy today. She denies shortness of breath. It appears she is back on 4 L by nasal cannula. Her oxygen saturations are in the high 90s. She diuresed fairly well yesterday and had a negative fluid balance of 1095.  Objective: Vital signs in last 24 hours: Filed Vitals:   02/07/14 1102 02/07/14 2304 02/08/14 0453 02/08/14 0500  BP: 92/36 93/60 89/54  95/65  Pulse: 73 67 74 97  Temp:  98.2 F (36.8 C) 98.5 F (36.9 C)   TempSrc:  Oral Oral   Resp:  18 18   Height:      Weight:      SpO2:  98% 97%    Weight change:   Intake/Output Summary (Last 24 hours) at 02/08/14 0742 Last data filed at 02/08/14 0622  Gross per 24 hour  Intake    580 ml  Output   1675 ml  Net  -1095 ml    Physical Exam: No distress. Lungs reveal minimal rales. Heart irregular with a grade 3 systolic murmur. Abdomen soft and nontender. Extremities reveal no edema.  Lab Results:    Results for orders placed during the hospital encounter of 02/04/14 (from the past 24 hour(s))  GLUCOSE, CAPILLARY     Status: Abnormal   Collection Time    02/07/14  7:59 AM      Result Value Ref Range   Glucose-Capillary 141 (*) 70 - 99 mg/dL   Comment 1 Notify RN    GLUCOSE, CAPILLARY     Status: Abnormal   Collection Time    02/07/14 11:32 AM      Result Value Ref Range   Glucose-Capillary 129 (*) 70 - 99 mg/dL   Comment 1 Notify RN    GLUCOSE, CAPILLARY     Status: Abnormal   Collection Time    02/07/14  4:14 PM      Result Value Ref Range   Glucose-Capillary 110 (*) 70 - 99 mg/dL   Comment 1 Notify RN    GLUCOSE, CAPILLARY     Status: Abnormal   Collection Time    02/07/14  8:11 PM      Result Value Ref Range   Glucose-Capillary 142 (*) 70 - 99 mg/dL  BASIC METABOLIC PANEL     Status: Abnormal   Collection Time    02/08/14  5:43 AM      Result Value Ref Range   Sodium 143  137 - 147 mEq/L   Potassium 4.5  3.7 - 5.3 mEq/L   Chloride 102  96 -  112 mEq/L   CO2 33 (*) 19 - 32 mEq/L   Glucose, Bld 110 (*) 70 - 99 mg/dL   BUN 31 (*) 6 - 23 mg/dL   Creatinine, Ser 1.610.76  0.50 - 1.10 mg/dL   Calcium 8.6  8.4 - 09.610.5 mg/dL   GFR calc non Af Amer 68 (*) >90 mL/min   GFR calc Af Amer 79 (*) >90 mL/min   Anion gap 8  5 - 15     ABGS No results found for this basename: PHART, PCO2, PO2ART, TCO2, HCO3,  in the last 72 hours CULTURES Recent Results (from the past 240 hour(s))  MRSA PCR SCREENING     Status: None   Collection Time    01/30/14 11:20 AM      Result Value Ref Range Status   MRSA by PCR NEGATIVE  NEGATIVE Final   Comment:  The GeneXpert MRSA Assay (FDA     approved for NASAL specimens     only), is one component of a     comprehensive MRSA colonization     surveillance program. It is not     intended to diagnose MRSA     infection nor to guide or     monitor treatment for     MRSA infections.   Studies/Results: No results found. Micro Results: Recent Results (from the past 240 hour(s))  MRSA PCR SCREENING     Status: None   Collection Time    01/30/14 11:20 AM      Result Value Ref Range Status   MRSA by PCR NEGATIVE  NEGATIVE Final   Comment:            The GeneXpert MRSA Assay (FDA     approved for NASAL specimens     only), is one component of a     comprehensive MRSA colonization     surveillance program. It is not     intended to diagnose MRSA     infection nor to guide or     monitor treatment for     MRSA infections.   Studies/Results: No results found. Medications:  I have reviewed the patient's current medications Scheduled Meds: . aspirin EC  81 mg Oral Daily  . atorvastatin  40 mg Oral q1800  . doxycycline  100 mg Oral Q12H  . enoxaparin (LOVENOX) injection  40 mg Subcutaneous Q24H  . fluconazole  100 mg Oral Daily  . furosemide  40 mg Intravenous BID  . insulin aspart  0-5 Units Subcutaneous QHS  . insulin aspart  0-9 Units Subcutaneous TID WC  . losartan  50 mg Oral Daily   . metoprolol succinate  50 mg Oral Daily  . potassium chloride SA  20 mEq Oral BID  . sodium chloride  3 mL Intravenous Q12H  . spironolactone  12.5 mg Oral Daily   Continuous Infusions:  PRN Meds:.acetaminophen, ondansetron (ZOFRAN) IV, ondansetron   Assessment/Plan: #1. Acute systolic heart failure following an antero-lateral MI. Continue IV Lasix. Renal function is stable. Continue Toprol-XL, losartan and spironolactone. Potassium is 4.5. Cardiology will see patient again today. #2. Acute respiratory failure. Improving. #3. Diabetes. Stable on nonpharmacologic therapy. #4. Dementia. Plan is to discharge back to SNF when her heart failure is improved further. Principal Problem:   CHF, acute on chronic Active Problems:   Edema of both legs   Candida infection   Aortic stenosis, moderate   Ischemic cardiomyopathy   DNR (do not resuscitate)   Dementia   DM (diabetes mellitus)   Systolic CHF, acute on chronic     LOS: 4 days   Darnell Stimson 02/08/2014, 7:42 AM

## 2014-02-08 NOTE — Progress Notes (Signed)
Patient's BP 95/57.  Pulse 94.  Dr. Ouida Sills notified.  Gave order to hold the Cozaar and administer the Lasix and Metoprolol.

## 2014-02-08 NOTE — Clinical Documentation Improvement (Signed)
Possible Clinical Conditions? Acute Respiratory Failure Acute on Chronic Respiratory Failure Chronic Respiratory Failure Other Condition Cannot Clinically Determine   Supporting Information: (As per ED note) "Nursing reports that this evening she developed increased shortness of breath and was found to be hypoxic, given supplemental oxygen but only achieved 86% saturation prior to arrival. Paramedics noted 86%, supplied nonrebreather oxygen at 100% with good response "  Thank You, Nevin Bloodgood ,RN, BSN, CCDS,Clinical Documentation Specialist:  [phone]  [phone]=[facility] [place]- Health Information Management

## 2014-02-08 NOTE — Progress Notes (Signed)
Pt's BP this morning was 89/26mmHg. Patient denied any lightheadedness or shortness of breath. MD notified. Dr. Janna Arch ordered a 200cc bolus of 0.9%NS. Fluid given and pt's BP improved to 95/5mmHg. Will continue to monitor.

## 2014-02-08 NOTE — Progress Notes (Signed)
Late entry:  1725 - Patient having frequent PVCs.  5 beat run noted.  BP improved.  Dr. Ouida Sills paged and notified. No new orders.  Continue to monitor.

## 2014-02-08 NOTE — Progress Notes (Signed)
Patient ID: TARON ZELAYA, female   DOB: 26-Dec-1914, 80 y.o.   MRN: [id number]    Subjective:   No chest pain. Has some SOB   Objective:   Temp:  [98.2 F (36.8 C)-98.5 F (36.9 C)] 98.5 F (36.9 C) (07/13 0453) Pulse Rate:  [67-97] 97 (07/13 0500) Resp:  [18] 18 (07/13 0453) BP: (89-95)/(36-65) 95/65 mmHg (07/13 0500) SpO2:  [97 %-98 %] 97 % (07/13 0748) Last BM Date: 02/05/14  Filed Weights   02/05/14 0402 02/05/14 0438  Weight: 133 lb 14.4 oz (60.737 kg) 133 lb 14.4 oz (60.737 kg)    Intake/Output Summary (Last 24 hours) at 02/08/14 8502 Last data filed at 02/08/14 7741  Gross per 24 hour  Intake    580 ml  Output   1675 ml  Net  -1095 ml    Telemetry: SR, PVCs  Exam:  General: NAD  Resp: CTAB  Cardiac: RRR, 3/6 systolic murmur RUSB, no JVD  OI:NOMVEHM soft, NT, ND  MSK: no LE edema  Neuro: no focal deficits  Psych: appropriate affect  Lab Results:  Basic Metabolic Panel:  Recent Labs Lab 02/06/14 0603 02/07/14 0606 02/08/14 0543  NA 141 144 143  K 4.1 3.9 4.5  CL 98 100 102  CO2 31 32 33*  GLUCOSE 109* 120* 110*  BUN 30* 32* 31*  CREATININE 0.83 0.85 0.76  CALCIUM 9.3 9.0 8.6    Liver Function Tests:  Recent Labs Lab 02/05/14 0003  AST 18  ALT 9  ALKPHOS 60  BILITOT 0.3  PROT 6.3  ALBUMIN 2.7*    CBC:  Recent Labs Lab 02/05/14 0003  WBC 8.6  HGB 12.5  HCT 36.9  MCV 98.9  PLT 170    Cardiac Enzymes:  Recent Labs Lab 02/05/14 0003  TROPONINI 0.62*    BNP:  Recent Labs  01/30/14 0743 02/05/14 0003  PROBNP 15235.0* 19583.0*    Coagulation:  Recent Labs Lab 02/05/14 0003  INR 1.13    ECG:   Medications:   Scheduled Medications: . aspirin EC  81 mg Oral Daily  . atorvastatin  40 mg Oral q1800  . doxycycline  100 mg Oral Q12H  . enoxaparin (LOVENOX) injection  40 mg Subcutaneous Q24H  . fluconazole  100 mg Oral Daily  . furosemide  40 mg Intravenous BID  . insulin aspart  0-5 Units  Subcutaneous QHS  . insulin aspart  0-9 Units Subcutaneous TID WC  . losartan  50 mg Oral Daily  . metoprolol succinate  50 mg Oral Daily  . potassium chloride SA  20 mEq Oral BID  . sodium chloride  3 mL Intravenous Q12H  . spironolactone  12.5 mg Oral Daily     Infusions:     PRN Medications:  acetaminophen, ondansetron (ZOFRAN) IV, ondansetron     Assessment/Plan   80 yo female hx of chronic systolic heart failure LVER 20-25%, recent admission with STEMI medically managed due to medical comorbidities and age, admitted with acute on chronic CHF.   1. Acute on chronic systolic heart failure - LVEF 09-47%, patient is s/p recent MI that was managed medically due to her age and comorbidities - she was discharged and readmitted one day later with fluid overload.  - net negative 1 liter yesterday, negative 3.2 liters since admission. Renal function is stable. She is on lasix 40mg  bid IV  - of note she was discharged on 2L Thermal oxygen. Currently on 2 L [place] this AM, states in  low to mid 90s. Prior to MI per family she had no O2 requirment.  - low normal blood pressures limit titration of CHF meds at this point, continue current doses.  - exam is improved, still with some symptoms and O2 requirement. Given her recent discharge and readmit, with continued SOB and oxygen requirment would lean toward continuing IV diuretic today with possible discharge tomorrow.   2. CAD - recent anterior STEMI, echo with LVEF 20-25% and anteroseptal akinesis. - managed medically at that time given her advanced age and comorbidites   3. Aortic stenosis - moderate to severe by recent echo - poor candidate for any form of intervention at any point   Dina RichJonathan Michele Kerlin, M.D., F.A.C.C.

## 2014-02-08 NOTE — Clinical Social Work Note (Signed)
CSW updated [facility] on pt. Facility continues to be willing to accept pt at d/c. CSW will follow.  Derenda Fennel, [place] [phone]

## 2014-02-09 ENCOUNTER — Inpatient Hospital Stay
Admission: RE | Admit: 2014-02-09 | Discharge: 2014-04-03 | Disposition: A | Discharge status: Home / Self Care | Payer: Medicare Other | Source: Ambulatory Visit | Attending: Internal Medicine | Admitting: Internal Medicine

## 2014-02-09 LAB — BASIC METABOLIC PANEL
Anion gap: 11 (ref 5–15)
BUN: 31 mg/dL — ABNORMAL HIGH (ref 6–23)
CO2: 35 mEq/L — ABNORMAL HIGH (ref 19–32)
Calcium: 9.4 mg/dL (ref 8.4–10.5)
Chloride: 100 mEq/L (ref 96–112)
Creatinine, Ser: 0.73 mg/dL (ref 0.50–1.10)
GFR, EST AFRICAN AMERICAN: 80 mL/min — AB (ref >90)
GFR, EST NON AFRICAN AMERICAN: 69 mL/min — AB (ref >90)
Glucose, Bld: 129 mg/dL — ABNORMAL HIGH (ref 70–99)
POTASSIUM: 4.2 meq/L (ref 3.7–5.3)
Sodium: 146 mEq/L (ref 137–147)

## 2014-02-09 LAB — GLUCOSE, CAPILLARY
GLUCOSE-CAPILLARY: 142 mg/dL — AB (ref 70–99)
Glucose-Capillary: 139 mg/dL — ABNORMAL HIGH (ref 70–99)

## 2014-02-09 MED ORDER — FLUCONAZOLE 100 MG PO TABS: 100.0000 mg | ORAL_TABLET | Freq: Every day | ORAL | Status: AC

## 2014-02-09 MED ORDER — SPIRONOLACTONE 12.5 MG HALF TABLET: 12.5000 mg | ORAL_TABLET | Freq: Every day | ORAL | Status: AC

## 2014-02-09 MED ORDER — FUROSEMIDE 40 MG PO TABS: 40.0000 mg | ORAL_TABLET | Freq: Two times a day (BID) | ORAL | Status: AC

## 2014-02-09 MED ORDER — LOSARTAN POTASSIUM 50 MG PO TABS: 50.0000 mg | ORAL_TABLET | Freq: Every day | ORAL | Status: AC

## 2014-02-09 NOTE — Clinical Social Work Note (Signed)
Pt d/c today back to [facility]. Pt, pt's son, and facility aware and agreeable. Pt to transfer with RN.  Derenda Fennel, [place] [phone]

## 2014-02-09 NOTE — Care Management Note (Signed)
    Page 1 of 1   02/09/2014     8:38:40 AM CARE MANAGEMENT NOTE 02/09/2014  Patient:  Carla Ward, Carla Ward   Account Number:  [id number]  Date Initiated:  02/09/2014  Documentation initiated by:  Sharrie Rothman  Subjective/Objective Assessment:   Pt admitted from [facility] center with CHF and hypoxia. Pt will return to facility at discharge.     Action/Plan:   Pt discharged today. CSW to arrange discharge to facility.   Anticipated DC Date:  02/09/2014   Anticipated DC Plan:  SKILLED NURSING FACILITY  In-house referral  Clinical Social Worker      DC Planning Services  CM consult      Choice offered to / List presented to:             Status of service:  Completed, signed off Medicare Important Message given?  YES (If response is "NO", the following Medicare IM given date fields will be blank) Date Medicare IM given:  02/09/2014 Medicare IM given by:  Sharrie Rothman Date Additional Medicare IM given:   Additional Medicare IM given by:    Discharge Disposition:  SKILLED NURSING FACILITY  Per UR Regulation:    If discussed at Long Length of Stay Meetings, dates discussed:   02/09/2014    Comments:  02/09/14 0840 Arlyss Queen, RN BSN CM

## 2014-02-09 NOTE — Progress Notes (Signed)
Pt discharged today to [facility] per Dr. Ouida Sills. Pt's IV site and foley d/c'd per protocol. Site WDL. Pt's VSS. Report called to Clydie Braun, nurse at [facility]. Questions answered accordingly. Pt left floor in stable condition via WC accompanied by NT.

## 2014-02-09 NOTE — Discharge Summary (Signed)
Physician Discharge Summary  Carla Ward YOV:[id number] DOB: Jun 30, 1915 DOA: 02/04/2014   Admit date: 02/04/2014 Discharge date: 02/09/2014  Discharge Diagnoses: acute respiratory failure Principal Problem:   CHF, acute on chronic Active Problems:   Edema of both legs   Candida infection   Aortic stenosis, moderate   Ischemic cardiomyopathy   DNR (do not resuscitate)   Dementia   DM (diabetes mellitus)   Systolic CHF, acute on chronic   Acute on chronic systolic CHF (congestive heart failure)    Wt Readings from Last 3 Encounters:  02/05/14 133 lb 14.4 oz (60.737 kg)  02/04/14 139 lb 1.6 oz (63.095 kg)     Hospital Course:  This patient is a 80 year old female who was readmitted 1 day after discharge because of shortness of breath and dropping oxygen saturations into the 80s. Chest x-ray was consistent with congestive heart failure. She was treated further with intravenous Lasix. She was also treated with spironolactone, Toprol-XL and losartan. She required supplemental oxygen which has now been reduced to 2 L by nasal cannula. She has had a gradual diuresis and is now improved to the point that she can be transferred back to skilled care. She's had no symptoms of recurrent chest pain following her recent anterolateral MI. She has been seen in consultation by cardiology. Diabetes has been stable and has not required an oral agent. She has aortic stenosis but is not considered an acceptable candidate for more extensive evaluation or treatment of this.  Her fungal-type skin infection involving her chest has been treated with Diflucan orally and ketoconazole topically. This is gradually improving.  A DO NOT RESUSCITATE orders in place.  She will be followed at the skilled nursing facility.  Please obtain a basic metabolic profile in one week. Please obtain a lipid profile in 8 weeks.   Discharge Instructions     Medication List         aspirin EC 81 MG tablet  Take 81 mg by  mouth daily.     atorvastatin 40 MG tablet  Commonly known as:  LIPITOR  Take 1 tablet (40 mg total) by mouth daily at 6 PM.     fluconazole 100 MG tablet  Commonly known as:  DIFLUCAN  Take 1 tablet (100 mg total) by mouth daily.     furosemide 40 MG tablet  Commonly known as:  LASIX  Take 1 tablet (40 mg total) by mouth 2 (two) times daily.     ketoconazole 2 % cream  Commonly known as:  NIZORAL  Apply topically 2 (two) times daily as needed for irritation.     losartan 50 MG tablet  Commonly known as:  COZAAR  Take 1 tablet (50 mg total) by mouth daily.     metoprolol succinate 50 MG 24 hr tablet  Commonly known as:  TOPROL-XL  Take 1 tablet (50 mg total) by mouth daily. Take with or immediately following a meal.     potassium chloride SA 20 MEQ tablet  Commonly known as:  K-DUR,KLOR-CON  Take 20 mEq by mouth daily.     spironolactone 12.5 mg Tabs tablet  Commonly known as:  ALDACTONE  Take 0.5 tablets (12.5 mg total) by mouth daily.         Elisavet Buehrer 02/09/2014

## 2014-02-10 NOTE — Progress Notes (Signed)
UR chart review completed.  

## 2014-02-12 LAB — GLUCOSE, CAPILLARY: GLUCOSE-CAPILLARY: 119 mg/dL — AB (ref 70–99)

## 2014-02-15 LAB — GLUCOSE, CAPILLARY: GLUCOSE-CAPILLARY: 101 mg/dL — AB (ref 70–99)

## 2014-02-17 ENCOUNTER — Encounter: Payer: Self-pay | Admitting: *Deleted

## 2014-02-17 ENCOUNTER — Encounter: Payer: Medicare Other | Admitting: Physician Assistant

## 2014-02-19 LAB — GLUCOSE, CAPILLARY: GLUCOSE-CAPILLARY: 97 mg/dL (ref 70–99)

## 2014-02-24 NOTE — H&P (Unsigned)
Carla Ward, Carla Ward                ACCOUNT NO.:  [id number]  MEDICAL RECORD NO.:  [id number]  LOCATION:  S136                          FACILITY:  [facility]  PHYSICIAN:  Kingsley Callander. Ouida Sills, MD       DATE OF BIRTH:  1915-06-01  DATE OF ADMISSION:  02/09/2014 DATE OF DISCHARGE:  LH                             HISTORY & PHYSICAL   HISTORY OF PRESENT ILLNESS:  This patient is a 80 year old female who was transferred [name] after being treated for congestive heart failure following a recent anterolateral MI.  She was treated with IV Lasix.  She gradually improved.  Her oxygen saturations improved.  She had no further symptoms of chest pain.  She has moderate aortic stenosis.  She has been evaluated by Cardiology.  No interventions or further diagnostic studies such as a heart catheterization will be planned.  She has had some intermittent confusion related to underlying dementia.  PAST MEDICAL HISTORY: 1. Coronary artery disease. 2. Congestive heart failure. 3. Hypertension. 4. Alzheimer disease. 5. Diabetes. 6. Chronic leg edema. 7. Aortic stenosis. 8. Gout.  MEDICATIONS: 1. Aspirin 81 mg daily. 2. Lipitor 40 mg daily. 3. Diflucan 100 mg daily. 4. Lasix 40 mg twice a day. 5. Ketoconazole cream twice a day. 6. Losartan 50 mg daily. 7. Metoprolol-XL 50 mg daily. 8. Potassium 20 mEq daily. 9. Aldactone 12.5 mg daily.  ALLERGIES:  None.  SOCIAL HISTORY:  She does not smoke, drink, or use recreational drugs.  FAMILY HISTORY:  Father died at 54 of heart failure.  Sister died at 54 of stroke.  REVIEW OF SYSTEMS:  Currently, she has no chest pain or shortness of breath.  She is eating reasonably well.  She has had difficulty with incontinence.  PHYSICAL EXAMINATION:  GENERAL:  Alert and no distress. HEENT:  No scleral icterus.  Eyes, nose, and pharynx unremarkable. NECK:  No JVD or thyromegaly. LUNGS:  Clear. HEART:  Regular with a grade 2 systolic  murmur. ABDOMEN:  Soft and nontender with no palpable organomegaly. EXTREMITIES:  Chronic venous insufficiency changes in the lower legs. Edema has resolved. NEURO:  No focal weakness.  She is intermittently confused. LYMPH NODES:  No cervical or supraclavicular enlargement.  IMPRESSION/PLAN: 1. Acute on chronic systolic heart failure improved with medical     therapy.  Continue Lasix, losartan, and metoprolol-XL. 2. Coronary artery disease status post myocardial infarction.     Continue medical therapy.  Do not resuscitate order is in place. 3. Alzheimer disease, stable. 4. Candida skin infection.  Continue Diflucan and ketoconazole. 5. Aortic stenosis. 6. Diabetes.  Glucoses have been very well controlled with dietary     therapy. 7. Hypertension, controlled.     Kingsley Callander. Ouida Sills, MD     ROF/MEDQ  D:  02/24/2014  T:  02/24/2014  Job:  [id number]

## 2014-02-26 LAB — GLUCOSE, CAPILLARY: Glucose-Capillary: 103 mg/dL — ABNORMAL HIGH (ref 70–99)

## 2014-03-05 LAB — GLUCOSE, CAPILLARY: Glucose-Capillary: 107 mg/dL — ABNORMAL HIGH (ref 70–99)

## 2014-03-12 LAB — GLUCOSE, CAPILLARY: Glucose-Capillary: 116 mg/dL — ABNORMAL HIGH (ref 70–99)

## 2014-03-19 LAB — GLUCOSE, CAPILLARY: Glucose-Capillary: 114 mg/dL — ABNORMAL HIGH (ref 70–99)

## 2014-03-20 LAB — GLUCOSE, CAPILLARY: GLUCOSE-CAPILLARY: 326 mg/dL — AB (ref 70–99)

## 2014-03-21 LAB — GLUCOSE, CAPILLARY: GLUCOSE-CAPILLARY: 241 mg/dL — AB (ref 70–99)

## 2014-03-24 NOTE — Progress Notes (Unsigned)
Carla Ward, Carla Ward                ACCOUNT NO.:  [id number]  MEDICAL RECORD NO.:  [id number]  LOCATION:                                 FACILITY:  PHYSICIAN:  Kingsley Callander. Ouida Sills, MD       DATE OF BIRTH:  10-11-1914  DATE OF PROCEDURE:  03/19/2014 DATE OF DISCHARGE:                                PROGRESS NOTE   SUBJECTIVE:  [name] was rechecked at the [facility].  She has recently had some systolic blood pressures in the 80s.  She denies any symptoms of recurrent chest pain following her MI.  She has not had any sign of heart failure exacerbation.  She is eating reasonably well.  OBJECTIVE:  GENERAL:  She is alert. EYES:  She has some blepharitis type changes of her eyes.  She has recently received baby shampoo soaks for eyes and has been treated with gentamicin. LUNGS:  Clear. HEART:  Regular with a grade 2 systolic murmur. ABDOMEN:  Soft and nontender. EXTREMITIES:  Reveal no edema.  IMPRESSION/PLAN: 1. Coronary artery disease, status post MI, asymptomatic. 2. Congestive heart failure.  Lasix has been decreased after her     recent low blood pressures.  We will continue dosing at 40 mg once     a day instead of twice a day.  Atenolol has also been decreased.     We will continue losartan and spironolactone at current doses.  Her     electrolytes are normal.  Her BUN is elevated at 55; however, her     creatinine remains in the normal range. 3. Blepharitis.  Await Ophthalmology visit.     Kingsley Callander. Ouida Sills, MD     ROF/MEDQ  D:  03/24/2014  T:  03/24/2014  Job:  [id number]

## 2014-03-26 LAB — GLUCOSE, CAPILLARY: Glucose-Capillary: 147 mg/dL — ABNORMAL HIGH (ref 70–99)

## 2014-04-02 LAB — GLUCOSE, CAPILLARY: Glucose-Capillary: 127 mg/dL — ABNORMAL HIGH (ref 70–99)

## 2014-04-06 ENCOUNTER — Other Ambulatory Visit: Payer: Self-pay | Admitting: Hematology and Oncology

## 2014-04-06 ENCOUNTER — Other Ambulatory Visit: Payer: Self-pay | Admitting: Emergency Medicine

## 2014-04-08 ENCOUNTER — Other Ambulatory Visit: Payer: Self-pay | Admitting: Internal Medicine

## 2014-04-08 ENCOUNTER — Encounter: Payer: Self-pay | Admitting: Nurse Practitioner

## 2014-04-08 ENCOUNTER — Other Ambulatory Visit: Payer: Self-pay | Admitting: Nurse Practitioner

## 2014-04-08 DIAGNOSIS — I5043 Acute on chronic combined systolic (congestive) and diastolic (congestive) heart failure: Secondary | ICD-10-CM

## 2014-04-08 DIAGNOSIS — I959 Hypotension, unspecified: Secondary | ICD-10-CM

## 2014-04-08 DIAGNOSIS — I214 Non-ST elevation (NSTEMI) myocardial infarction: Secondary | ICD-10-CM

## 2014-04-08 DIAGNOSIS — R0602 Shortness of breath: Secondary | ICD-10-CM

## 2014-04-25 ENCOUNTER — Other Ambulatory Visit: Payer: Self-pay | Admitting: Emergency Medicine

## 2014-04-29 DEATH — deceased

## 2015-07-03 IMAGING — CR DG CHEST 1V PORT
1 series · 1 of 1 positions shown · non-contrast
Comparison: None.

CLINICAL DATA: Weakness.

EXAM:
PORTABLE CHEST - 1 VIEW

[portable]
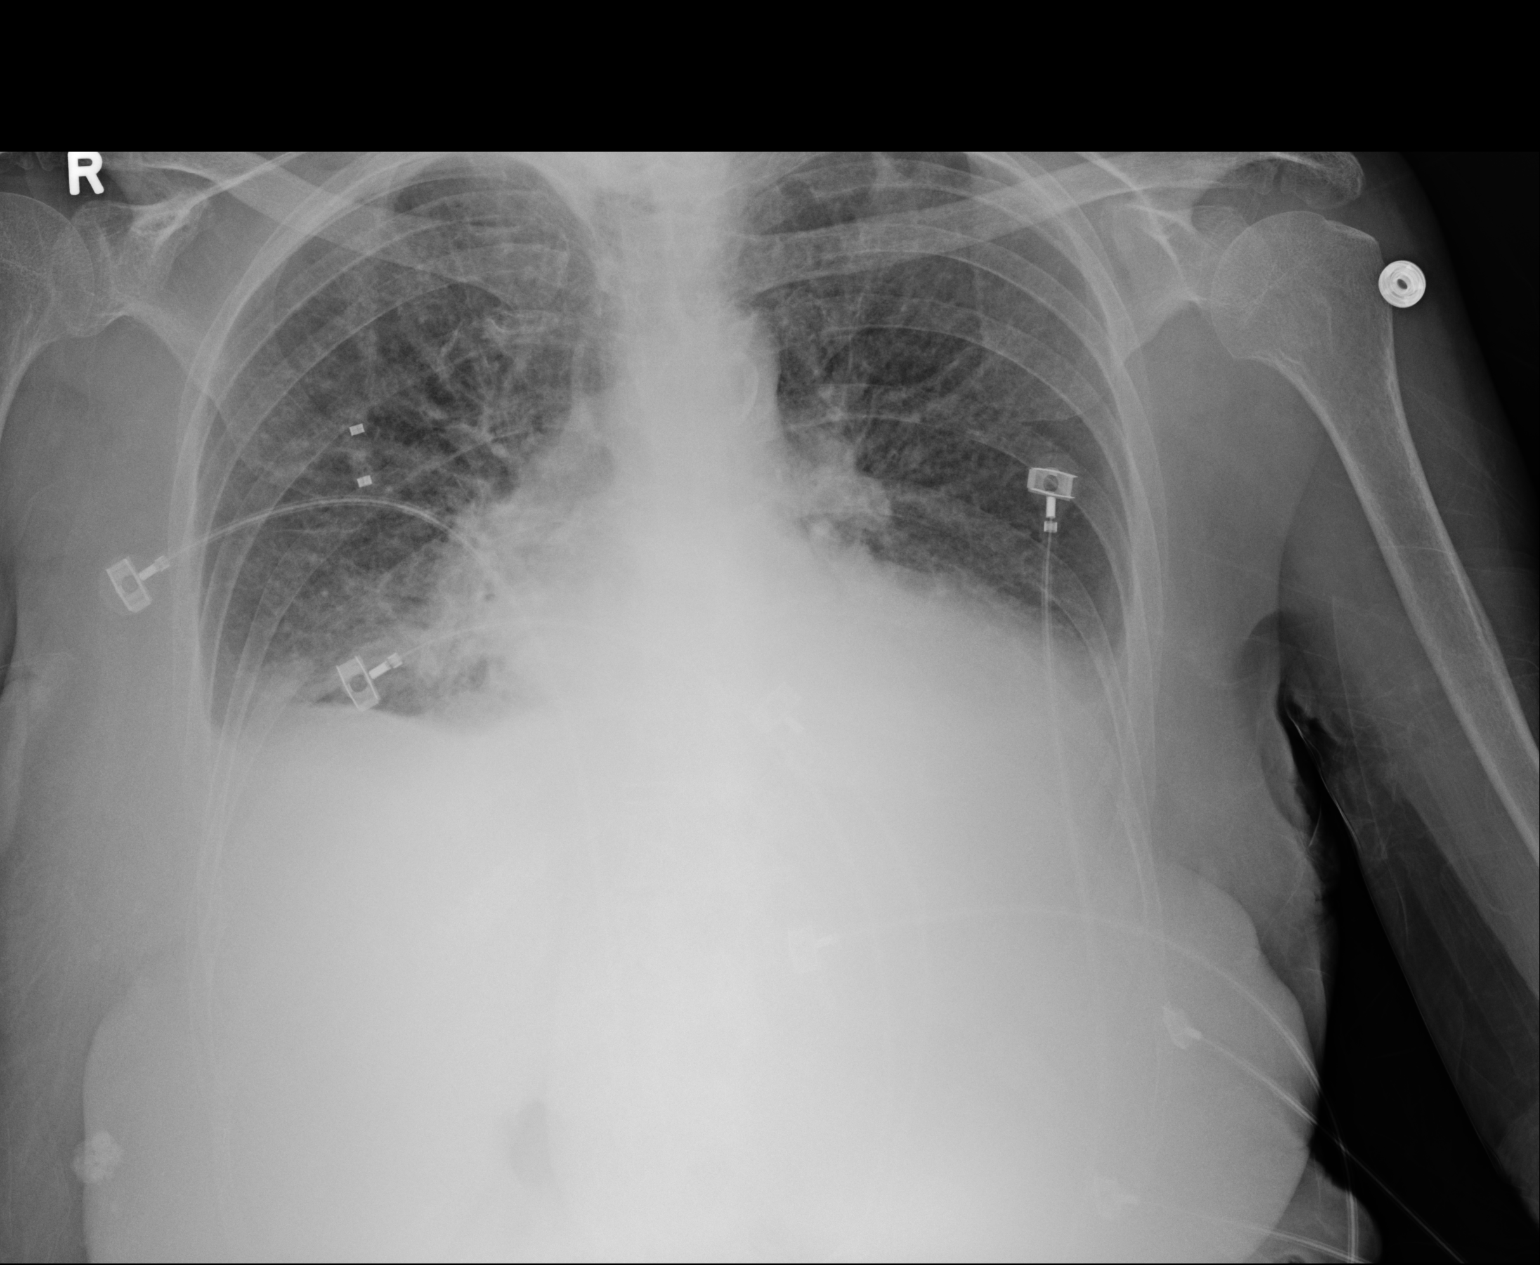

[1 of 1 positions shown; findings below may reference images not displayed]

FINDINGS: The heart is mildly enlarged. Bilateral pleural effusions are
present. Bibasilar airspace disease likely reflects atelectasis.
Mild diffuse edema is superimposed on chronic interstitial
coarsening. Atherosclerotic changes are noted at the aortic arch.
The upper lung fields are clear consolidation. The visualized soft
tissues and bony thorax are unremarkable.
IMPRESSION: 1. Congestive heart failure.
2. Bibasilar airspace disease likely reflects atelectasis.
3. Atherosclerosis.

## 2015-07-09 IMAGING — CR DG CHEST 1V PORT
1 series · 1 of 1 positions shown · non-contrast
Comparison: Chest radiograph January 30, 2014

CLINICAL DATA: Shortness of breath, dementia.  History of CHF.

EXAM:
PORTABLE CHEST - 1 VIEW

[portable]
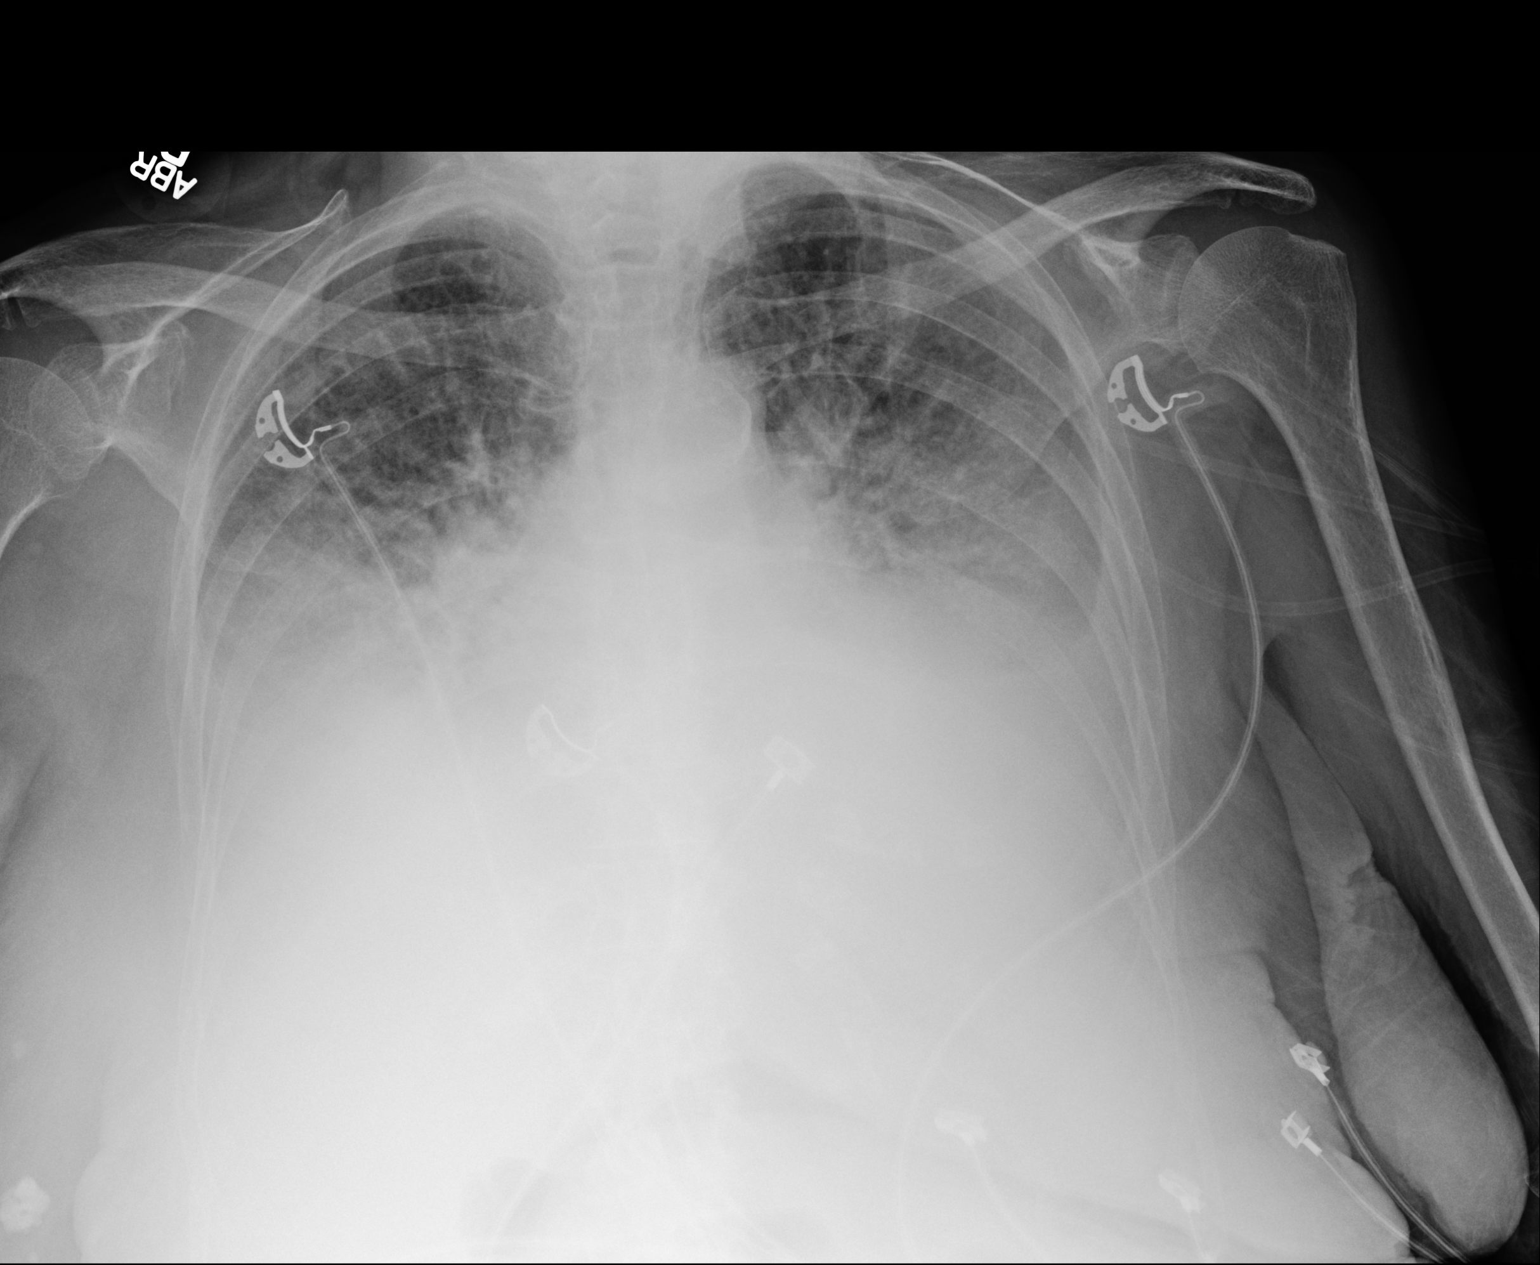

[1 of 1 positions shown; findings below may reference images not displayed]

FINDINGS: The cardiac silhouette remains moderately enlarged, mediastinal
silhouette is nonsuspicious; calcified aortic knob. Increasing
central pulmonary vascular congestion, interstitial prominence with
bibasilar airspace opacities, small to moderate bilateral pleural
effusions.

No pneumothorax. Patient is osteopenic. Multiple EKG lines overlie
the patient and may obscure subtle underlying pathology.
IMPRESSION: Stable cardiomegaly, worsening interstitial and alveolar airspace
opacities likely reflect pulmonary edema with increasing small to
moderate bilateral pleural effusions.

  By: Joe Chado Isezerano
